# Patient Record
Sex: Male | Born: 1990 | Race: White | Hispanic: No | Marital: Married | Smoking: Never smoker
Health system: Southern US, Community
[De-identification: ages and names within clinical notes are randomized; demographics above are authoritative.]

## PROBLEM LIST (undated history)

## (undated) DIAGNOSIS — I493 Ventricular premature depolarization: Secondary | ICD-10-CM

## (undated) DIAGNOSIS — Z789 Other specified health status: Secondary | ICD-10-CM

## (undated) DIAGNOSIS — R002 Palpitations: Secondary | ICD-10-CM

## (undated) HISTORY — PX: OTHER SURGICAL HISTORY: SHX169

## (undated) HISTORY — DX: Other specified health status: Z78.9

## (undated) HISTORY — DX: Ventricular premature depolarization: I49.3

## (undated) HISTORY — DX: Palpitations: R00.2

---

## 2019-09-12 ENCOUNTER — Encounter (HOSPITAL_COMMUNITY): Payer: Self-pay

## 2019-09-12 ENCOUNTER — Emergency Department (HOSPITAL_COMMUNITY)
Admission: EM | Admit: 2019-09-12 | Discharge: 2019-09-12 | Disposition: A | Discharge status: Home / Self Care | Payer: Self-pay | Attending: Emergency Medicine | Admitting: Emergency Medicine

## 2019-09-12 ENCOUNTER — Other Ambulatory Visit: Payer: Self-pay

## 2019-09-12 DIAGNOSIS — R682 Dry mouth, unspecified: Secondary | ICD-10-CM | POA: Insufficient documentation

## 2019-09-12 DIAGNOSIS — R350 Frequency of micturition: Secondary | ICD-10-CM | POA: Insufficient documentation

## 2019-09-12 DIAGNOSIS — R002 Palpitations: Secondary | ICD-10-CM | POA: Insufficient documentation

## 2019-09-12 DIAGNOSIS — F419 Anxiety disorder, unspecified: Secondary | ICD-10-CM | POA: Insufficient documentation

## 2019-09-12 LAB — URINALYSIS, ROUTINE W REFLEX MICROSCOPIC
Bilirubin Urine: NEGATIVE
Glucose, UA: NEGATIVE mg/dL
Hgb urine dipstick: NEGATIVE
Ketones, ur: 20 mg/dL — AB
Leukocytes,Ua: NEGATIVE
Nitrite: NEGATIVE
Protein, ur: NEGATIVE mg/dL
Specific Gravity, Urine: 1.01 (ref 1.005–1.030)
pH: 6 (ref 5.0–8.0)

## 2019-09-12 LAB — COMPREHENSIVE METABOLIC PANEL
ALT: 24 U/L (ref 0–44)
AST: 24 U/L (ref 15–41)
Albumin: 4.4 g/dL (ref 3.5–5.0)
Alkaline Phosphatase: 65 U/L (ref 38–126)
Anion gap: 9 (ref 5–15)
BUN: 14 mg/dL (ref 6–20)
CO2: 26 mmol/L (ref 22–32)
Calcium: 9.2 mg/dL (ref 8.9–10.3)
Chloride: 104 mmol/L (ref 98–111)
Creatinine, Ser: 0.82 mg/dL (ref 0.61–1.24)
GFR calc Af Amer: >60 mL/min (ref >60)
GFR calc non Af Amer: >60 mL/min (ref >60)
Glucose, Bld: 106 mg/dL — ABNORMAL HIGH (ref 70–99)
Potassium: 3.5 mmol/L (ref 3.5–5.1)
Sodium: 139 mmol/L (ref 135–145)
Total Bilirubin: 0.8 mg/dL (ref 0.3–1.2)
Total Protein: 7.1 g/dL (ref 6.5–8.1)

## 2019-09-12 LAB — RAPID URINE DRUG SCREEN, HOSP PERFORMED
Amphetamines: NOT DETECTED
Barbiturates: NOT DETECTED
Benzodiazepines: NOT DETECTED
Cocaine: NOT DETECTED
Opiates: NOT DETECTED
Tetrahydrocannabinol: NOT DETECTED

## 2019-09-12 LAB — CBC
HCT: 41.2 % (ref 39.0–52.0)
Hemoglobin: 13.7 g/dL (ref 13.0–17.0)
MCH: 28.8 pg (ref 26.0–34.0)
MCHC: 33.3 g/dL (ref 30.0–36.0)
MCV: 86.6 fL (ref 80.0–100.0)
Platelets: 258 10*3/uL (ref 150–400)
RBC: 4.76 MIL/uL (ref 4.22–5.81)
RDW: 13.2 % (ref 11.5–15.5)
WBC: 5.4 10*3/uL (ref 4.0–10.5)
nRBC: 0 % (ref 0.0–0.2)

## 2019-09-12 LAB — CBG MONITORING, ED: Glucose-Capillary: 95 mg/dL (ref 70–99)

## 2019-09-12 LAB — TSH: TSH: 2.272 u[IU]/mL (ref 0.350–4.500)

## 2019-09-12 NOTE — ED Triage Notes (Addendum)
Pt reports feeling palpitations, increased urination, and dry mouth since he laid down to go to sleep a few hours ago. He reports increased stress and anxiety lately. Denies pain.

## 2019-09-12 NOTE — ED Notes (Signed)
Pt declined dc vitals.

## 2019-09-12 NOTE — ED Provider Notes (Signed)
[name] Provider Note   CSN: [id number] Arrival date & time: 09/12/19  0254     History Chief Complaint  Patient presents with  . Palpitations  . Dry Mouth    Lucas Austin is a 29 y.o. male.  Patient with no pertinent past medical history presents to the emergency department with a chief complaint of palpitations.  He states that he noticed the symptoms tonight.  He states that it feels like his "heart is jumping."  He denies pain or shortness of breath.  She denies any fevers, chills, or cough.  Denies ever experiencing any symptoms before.  Denies any stimulant use from some Diet Coke/caffeine.  He denies any drug or alcohol use.  Denies any other associated symptoms.  The history is provided by the patient. No language interpreter was used.       History reviewed. No pertinent past medical history.  There are no problems to display for this patient.    History reviewed. No pertinent family history.  Social History   Tobacco Use  . Smoking status: Not on file  Substance Use Topics  . Alcohol use: Not on file  . Drug use: Not on file    Home Medications Prior to Admission medications   Not on File    Allergies    Patient has no known allergies.  Review of Systems   Review of Systems  All other systems reviewed and are negative.   Physical Exam Updated Vital Signs BP (!) 152/94 (BP Location: Right Arm)   Pulse 98   Temp 98.7 F (37.1 C) (Oral)   Resp 20   Ht 5\' 7"  (1.702 m)   Wt 72.6 kg   SpO2 100%   BMI 25.06 kg/m   Physical Exam Vitals and nursing note reviewed.  Constitutional:      Appearance: He is well-developed.  HENT:     Head: Normocephalic and atraumatic.  Eyes:     Conjunctiva/sclera: Conjunctivae normal.  Cardiovascular:     Rate and Rhythm: Normal rate and regular rhythm.     Heart sounds: No murmur.     Comments: Normal rate on my exam Pulmonary:     Effort: Pulmonary effort is  normal. No respiratory distress.     Breath sounds: Normal breath sounds.  Abdominal:     Palpations: Abdomen is soft.     Tenderness: There is no abdominal tenderness.  Musculoskeletal:     Cervical back: Neck supple.  Skin:    General: Skin is warm and dry.  Neurological:     Mental Status: He is alert and oriented to person, place, and time.  Psychiatric:        Mood and Affect: Mood normal.     Comments: anxious     ED Results / Procedures / Treatments   Labs (all labs ordered are listed, but only abnormal results are displayed) Labs Reviewed  COMPREHENSIVE METABOLIC PANEL - Abnormal; Notable for the following components:      Result Value   Glucose, Bld 106 (*)    All other components within normal limits  URINALYSIS, ROUTINE W REFLEX MICROSCOPIC - Abnormal; Notable for the following components:   Color, Urine STRAW (*)    Ketones, ur 20 (*)    All other components within normal limits  CBC  TSH  RAPID URINE DRUG SCREEN, HOSP PERFORMED  CBG MONITORING, ED    EKG EKG Interpretation  Date/Time:  Sunday Sep 12 2019 03:20:19 EDT Ventricular  Rate:  100 PR Interval:    QRS Duration: 94 QT Interval:  325 QTC Calculation: 420 R Axis:   65 Text Interpretation: Sinus tachycardia 12 Lead; Mason-Likar No old tracing to compare Confirmed by Ward, Cyril Mourning [phone]) on 09/12/2019 3:27:14 AM   Radiology No results found.  Procedures Procedures (including critical care time)  Medications Ordered in ED Medications - No data to display  ED Course  I have reviewed the triage vital signs and the nursing notes.  Pertinent labs & imaging results that were available during my care of the patient were reviewed by me and considered in my medical decision making (see chart for details).    MDM Rules/Calculators/A&P                      This patient complains of palpitations, this involves an extensive number of treatment options, and is a complaint that carries with it a high  risk of complications and morbidity.  The differential diagnosis includes dysrhythmia, electrolyte abnormality, stimulant, anxiety.  Pertinent Labs I ordered, reviewed, and interpreted labs, which included CBC, CMP, TSH, UDS, UA, which are all reassuring.  He is not hypoxic or complaining of CP/SOB.  I doubt PE.  VSS. I don't think that the patient needs additional workup or admission tonight.  I think that he is stable for discharge and can follow-up with his PCP and/or cardiology.  Final Clinical Impression(s) / ED Diagnoses Final diagnoses:  Palpitations    Rx / DC Orders ED Discharge Orders    None       Montine Circle, PA-C 09/12/19 0505    Ward, Delice Bison, DO 09/12/19 [phone]

## 2019-09-14 ENCOUNTER — Emergency Department (HOSPITAL_COMMUNITY)
Admission: EM | Admit: 2019-09-14 | Discharge: 2019-09-14 | Disposition: A | Discharge status: Home / Self Care | Payer: Self-pay | Attending: Emergency Medicine | Admitting: Emergency Medicine

## 2019-09-14 ENCOUNTER — Encounter (HOSPITAL_COMMUNITY): Payer: Self-pay | Admitting: Emergency Medicine

## 2019-09-14 ENCOUNTER — Emergency Department (HOSPITAL_COMMUNITY): Payer: Self-pay

## 2019-09-14 DIAGNOSIS — R002 Palpitations: Secondary | ICD-10-CM | POA: Insufficient documentation

## 2019-09-14 DIAGNOSIS — Z79899 Other long term (current) drug therapy: Secondary | ICD-10-CM | POA: Insufficient documentation

## 2019-09-14 DIAGNOSIS — F419 Anxiety disorder, unspecified: Secondary | ICD-10-CM | POA: Insufficient documentation

## 2019-09-14 DIAGNOSIS — R0789 Other chest pain: Secondary | ICD-10-CM | POA: Insufficient documentation

## 2019-09-14 LAB — D-DIMER, QUANTITATIVE: D-Dimer, Quant: <0.27 ug/mL-FEU (ref 0.00–0.50)

## 2019-09-14 LAB — CBC
HCT: 45.8 % (ref 39.0–52.0)
Hemoglobin: 15.2 g/dL (ref 13.0–17.0)
MCH: 29 pg (ref 26.0–34.0)
MCHC: 33.2 g/dL (ref 30.0–36.0)
MCV: 87.4 fL (ref 80.0–100.0)
Platelets: 271 10*3/uL (ref 150–400)
RBC: 5.24 MIL/uL (ref 4.22–5.81)
RDW: 13 % (ref 11.5–15.5)
WBC: 5.2 10*3/uL (ref 4.0–10.5)
nRBC: 0 % (ref 0.0–0.2)

## 2019-09-14 LAB — BASIC METABOLIC PANEL
Anion gap: 11 (ref 5–15)
BUN: 9 mg/dL (ref 6–20)
CO2: 27 mmol/L (ref 22–32)
Calcium: 9.5 mg/dL (ref 8.9–10.3)
Chloride: 102 mmol/L (ref 98–111)
Creatinine, Ser: 0.74 mg/dL (ref 0.61–1.24)
GFR calc Af Amer: >60 mL/min (ref >60)
GFR calc non Af Amer: >60 mL/min (ref >60)
Glucose, Bld: 102 mg/dL — ABNORMAL HIGH (ref 70–99)
Potassium: 4.2 mmol/L (ref 3.5–5.1)
Sodium: 140 mmol/L (ref 135–145)

## 2019-09-14 LAB — MAGNESIUM: Magnesium: 2.1 mg/dL (ref 1.7–2.4)

## 2019-09-14 LAB — TROPONIN I (HIGH SENSITIVITY): Troponin I (High Sensitivity): 2 ng/L (ref <18)

## 2019-09-14 MED ORDER — SODIUM CHLORIDE 0.9% FLUSH: 3.0000 mL | Freq: Once | INTRAVENOUS | Status: DC

## 2019-09-14 MED ORDER — HYDROXYZINE HCL 25 MG PO TABS
25.0000 mg | ORAL_TABLET | Freq: Four times a day (QID) | ORAL | 0 refills | Status: DC
Start: 2019-09-14 — End: 2019-10-02

## 2019-09-14 NOTE — ED Triage Notes (Signed)
Patient here from home reporting palpitations since Saturday. Seen for same on 5/23. Also reports increased stress and anxiety.

## 2019-09-14 NOTE — Discharge Instructions (Addendum)
You were labs today did not show a specific cause for your palpitations. It is important for you to follow-up with the cardiologist tomorrow for possible monitoring. Return to the ER for any worsening palpitations, chest pain, shortness of breath, leg swelling.

## 2019-09-14 NOTE — ED Provider Notes (Signed)
[facility]-EMERGENCY DEPT Provider Note   CSN: [id number] Arrival date & time: 09/14/19  1313     History Chief Complaint  Patient presents with  . Palpitations    Lucas Austin is a 29 y.o. male who presents to ED with a chief complaint of palpitations.  Beginning 09/12/2019 patient reports intermittent palpitations without specific aggravating or alleviating factor.  Reports a burning sensation in his chest as well.  He was seen and evaluated when symptoms began with reassuring work-up including TSH.  He initially thought this was due to using Rogaine for his hair loss as he read on Google that this could be a side effect.  However since then he has not to use the Rogaine but continues to have symptoms.  He does report increased anxiety since his symptoms began.  He denies any shortness of breath, leg swelling, history of DVT PE, MI, recent immobilization, vomiting, diarrhea, abdominal pain.  HPI     History reviewed. No pertinent past medical history.  There are no problems to display for this patient.   History reviewed. No pertinent surgical history.     No family history on file.  Social History   Tobacco Use  . Smoking status: Never Smoker  . Smokeless tobacco: Never Used  Substance Use Topics  . Alcohol use: Never  . Drug use: Never    Home Medications Prior to Admission medications   Medication Sig Start Date End Date Taking? Authorizing Provider  MINOXIDIL, TOPICAL, (ROGAINE EXTRA STRENGTH) 5 % SOLN Apply 1 application topically in the morning and at bedtime.   Yes [provider]  hydrOXYzine (ATARAX/VISTARIL) 25 MG tablet Take 1 tablet (25 mg total) by mouth every 6 (six) hours. 09/14/19   Dietrich Pates, PA-C    Allergies    Patient has no known allergies.  Review of Systems   Review of Systems  Constitutional: Negative for appetite change, chills and fever.  HENT: Negative for ear pain, rhinorrhea, sneezing and  sore throat.   Eyes: Negative for photophobia and visual disturbance.  Respiratory: Positive for chest tightness. Negative for cough, shortness of breath and wheezing.   Cardiovascular: Positive for palpitations. Negative for chest pain.  Gastrointestinal: Negative for abdominal pain, blood in stool, constipation, diarrhea, nausea and vomiting.  Genitourinary: Negative for dysuria, hematuria and urgency.  Musculoskeletal: Negative for myalgias.  Skin: Negative for rash.  Neurological: Negative for dizziness, weakness and light-headedness.    Physical Exam Updated Vital Signs BP 127/69   Pulse 87   Temp 98.3 F (36.8 C) (Oral)   Resp 16   SpO2 98%   Physical Exam Vitals and nursing note reviewed.  Constitutional:      General: He is not in acute distress.    Appearance: He is well-developed.  HENT:     Head: Normocephalic and atraumatic.     Nose: Nose normal.  Eyes:     General: No scleral icterus.       Right eye: No discharge.        Left eye: No discharge.     Conjunctiva/sclera: Conjunctivae normal.  Cardiovascular:     Rate and Rhythm: Normal rate and regular rhythm.     Heart sounds: Normal heart sounds. No murmur. No friction rub. No gallop.   Pulmonary:     Effort: Pulmonary effort is normal. No respiratory distress.     Breath sounds: Normal breath sounds.  Abdominal:     General: Bowel sounds are normal. There  is no distension.     Palpations: Abdomen is soft.     Tenderness: There is no abdominal tenderness. There is no guarding.  Musculoskeletal:        General: Normal range of motion.     Cervical back: Normal range of motion and neck supple.     Right lower leg: No edema.     Left lower leg: No edema.  Skin:    General: Skin is warm and dry.     Findings: No rash.  Neurological:     Mental Status: He is alert.     Motor: No abnormal muscle tone.     Coordination: Coordination normal.     ED Results / Procedures / Treatments   Labs (all labs  ordered are listed, but only abnormal results are displayed) Labs Reviewed  BASIC METABOLIC PANEL - Abnormal; Notable for the following components:      Result Value   Glucose, Bld 102 (*)    All other components within normal limits  CBC  D-DIMER, QUANTITATIVE (NOT AT [facility])  MAGNESIUM  TROPONIN I (HIGH SENSITIVITY)    EKG EKG Interpretation  Date/Time:  Tuesday Sep 14 2019 13:37:35 EDT Ventricular Rate:  87 PR Interval:    QRS Duration: 90 QT Interval:  323 QTC Calculation: 389 R Axis:   71 Text Interpretation: Sinus rhythm 12 Lead; Mason-Likar No significant change since last tracing Confirmed by Quintella Reichert [phone]) on 09/14/2019 6:45:52 PM   Radiology DG Chest 2 View  Result Date: 09/14/2019 CLINICAL DATA:  Chest palpitations since last night.  Chest burning. EXAM: CHEST - 2 VIEW COMPARISON:  None. FINDINGS: Normal heart, mediastinum and hila. Clear lungs.  No pleural effusion or pneumothorax. Skeletal structures are within normal limits. IMPRESSION: Normal chest radiographs. Electronically Signed   By: Lajean Manes M.D.   On: 09/14/2019 14:15    Procedures Procedures (including critical care time)  Medications Ordered in ED Medications - No data to display  ED Course  I have reviewed the triage vital signs and the nursing notes.  Pertinent labs & imaging results that were available during my care of the patient were reviewed by me and considered in my medical decision making (see chart for details).    MDM Rules/Calculators/A&P                      29 year old male presenting to the ED with a chief complaint of palpitations.  Symptoms have been going on for the past 2 to 3 days.  He was seen and evaluated 2 days ago for similar symptoms with reassuring work-up.  His symptoms have persisted despite discontinuing the Rogaine which he thought caused his symptoms.  He does report increased anxiety since he started having the palpitations.  On exam patient is overall  well-appearing.  He is not tachycardic, tachypneic or hypoxic.  No lower extremity edema, erythema or calf tenderness that concern me for DVT.  He is speaking in complete sentences without difficulty, no signs of respiratory distress.  Reviewed work-up from his visit 2 days ago.  TSH was normal then.  EKG here shows sinus rhythm, no changes from prior tracings.  Chest x-ray is unremarkable.  Obtain D-dimer due to ongoing palpitations.  This was negative.  Troponin is negative x1.  Magnesium, BMP and CBC unremarkable.  Suspect that symptoms could be due to anxiety.  Patient has an appointment with cardiologist tomorrow.  In the meantime we will give him Atarax to  help with anxiety and have him follow-up.  Patient advised to return for worsening symptoms.  All imaging, if done today, including plain films, CT scans, and ultrasounds, independently reviewed by me, and interpretations confirmed via formal radiology reads.  Patient is hemodynamically stable, in NAD, and able to ambulate in the ED. Evaluation does not show pathology that would require ongoing emergent intervention or inpatient treatment. I explained the diagnosis to the patient. Pain has been managed and has no complaints prior to discharge. Patient is comfortable with above plan and is stable for discharge at this time. All questions were answered prior to disposition. Strict return precautions for returning to the ED were discussed. Encouraged follow up with PCP.   An After Visit Summary was printed and given to the patient.   Portions of this note were generated with Scientist, clinical (histocompatibility and immunogenetics). Dictation errors may occur despite best attempts at proofreading.  Final Clinical Impression(s) / ED Diagnoses Final diagnoses:  Palpitations    Rx / DC Orders ED Discharge Orders         Ordered    hydrOXYzine (ATARAX/VISTARIL) 25 MG tablet  Every 6 hours     09/14/19 1934           Dietrich Pates, Cordelia Poche 09/14/19 Margie Billet, MD 09/16/19 [phone]

## 2019-09-15 ENCOUNTER — Telehealth: Payer: Self-pay | Admitting: Radiology

## 2019-09-15 ENCOUNTER — Ambulatory Visit (INDEPENDENT_AMBULATORY_CARE_PROVIDER_SITE_OTHER): Payer: Self-pay | Admitting: Cardiology

## 2019-09-15 ENCOUNTER — Other Ambulatory Visit: Payer: Self-pay

## 2019-09-15 ENCOUNTER — Encounter: Payer: Self-pay | Admitting: Cardiology

## 2019-09-15 VITALS — BP 112/72 | HR 110 | Ht 67.0 in | Wt 159.0 lb

## 2019-09-15 DIAGNOSIS — Z789 Other specified health status: Secondary | ICD-10-CM | POA: Insufficient documentation

## 2019-09-15 DIAGNOSIS — R0789 Other chest pain: Secondary | ICD-10-CM

## 2019-09-15 DIAGNOSIS — R002 Palpitations: Secondary | ICD-10-CM

## 2019-09-15 NOTE — Patient Instructions (Signed)
Medication Instructions:  Your physician recommends that you continue on your current medications as directed. Please refer to the Current Medication list given to you today.  *If you need a refill on your cardiac medications before your next appointment, please call your pharmacy*  Testing/Procedures: Your physician has recommended that you wear an event monitor. Event monitors are medical devices that record the heart's electrical activity. Doctors most often us these monitors to diagnose arrhythmias. Arrhythmias are problems with the speed or rhythm of the heartbeat. The monitor is a small, portable device. You can wear one while you do your normal daily activities. This is usually used to diagnose what is causing palpitations/syncope (passing out).  Follow-Up: At [facility], you and your health needs are our priority.  As part of our continuing mission to provide you with exceptional heart care, we have created designated Provider Care Teams.  These Care Teams include your primary Cardiologist (physician) and Advanced Practice Providers (APPs -  Physician Assistants and Nurse Practitioners) who all work together to provide you with the care you need, when you need it.  Follow up with Dr. Turner as needed based on results of testing   Other Instructions ZIO XT- Long Term Monitor Instructions   Your physician has requested you wear your ZIO patch monitor__14_days.   This is a single patch monitor.  [employer] supplies one patch monitor per enrollment.  Additional stickers are not available.   Please do not apply patch if you will be having a Nuclear Stress Test, Echocardiogram, Cardiac CT, MRI, or Chest Xray during the time frame you would be wearing the monitor. The patch cannot be worn during these tests.  You cannot remove and re-apply the ZIO XT patch monitor.   Your ZIO patch monitor will be sent USPS Priority mail from [employer] directly to your home address. The monitor  may also be mailed to a PO BOX if home delivery is not available.   It may take 3-5 days to receive your monitor after you have been enrolled.   Once you have received you monitor, please review enclosed instructions.  Your monitor has already been registered assigning a specific monitor serial # to you.   Applying the monitor   Shave hair from upper left chest.   Hold abrader disc by orange tab.  Rub abrader in 40 strokes over left upper chest as indicated in your monitor instructions.   Clean area with 4 enclosed alcohol pads .  Use all pads to assure are is cleaned thoroughly.  Let dry.   Apply patch as indicated in monitor instructions.  Patch will be place under collarbone on left side of chest with arrow pointing upward.   Rub patch adhesive wings for 2 minutes.Remove white label marked "1".  Remove white label marked "2".  Rub patch adhesive wings for 2 additional minutes.   While looking in a mirror, press and release button in center of patch.  A small green light will flash 3-4 times .  This will be your only indicator the monitor has been turned on.     Do not shower for the first 24 hours.  You may shower after the first 24 hours.   Press button if you feel a symptom. You will hear a small click.  Record Date, Time and Symptom in the Patient Log Book.   When you are ready to remove patch, follow instructions on last 2 pages of Patient Log Book.  Stick patch monitor onto last   page of Patient Log Book.   Place Patient Log Book in Blue box.  Use locking tab on box and tape box closed securely.  The Orange and White box has prepaid postage on it.  Please place in mailbox as soon as possible.  Your physician should have your test results approximately 7 days after the monitor has been mailed back to [employer].   Call [employer] Customer Care at [phone] if you have questions regarding your ZIO XT patch monitor.  Call them immediately if you see an orange light blinking  on your monitor.   If your monitor falls off in less than 4 days contact our Monitor department at [phone].  If your monitor becomes loose or falls off after 4 days call [employer] at [phone] for suggestions on securing your monitor.   

## 2019-09-15 NOTE — Progress Notes (Signed)
Cardiology Consult Note    Date:  09/16/2019   ID:  Lucas Austin, DOB 08/19/1990, MRN [id number]  PCP:  Patient, No Pcp Per  Cardiologist:  Armanda Magic, MD   Chief Complaint  Patient presents with  . New Patient (Initial Visit)    Palpitations and chest burning    History of Present Illness:  Lucas Austin is a 29 y.o. male who is being seen today for the evaluation of palpitations at the request of Ward, Layla Maw, DO.  This is a 29yo male with no PMH who recently saw his PCP and complained of palpitations and is not here for further evaluation.  He says that starting on 09/12/3019 he noticed intermittent palpitations that would come out of the blue without any known trigger.  He says that it would feel like a skipped beat once every 5 minutes.  He also was complaining of a burning sensation in his chest.  He was evaluated in ER when it started with normal workup with EKG,TSH.  He though it was related to Rogaine for his hair loss.  He presented back to the ER with recurrent palpitations.  Cxray, EKG and labs including trop and ddimer were normal.  He had a lot of anxiety and sx felt related to that.  He is now here for further evaluation. Since the last ER visit his palpitations have improved and seem to have tapered off and has not had any since yesterday.  He says that the burning sensation he had lasted 1-2 minutes and only had it 1-2 times and none since then.  He also had noticed some tingling in his head.   Past Medical History:  Diagnosis Date  . Known health problems: none     Past Surgical History:  Procedure Laterality Date  . no surgical history      Current Medications: No outpatient medications have been marked as taking for the 09/15/19 encounter (Office Visit) with Quintella Reichert, MD.    Allergies:   Patient has no known allergies.   Social History   Socioeconomic History  . Marital status: Single    Spouse name: Not on file  . Number of  children: Not on file  . Years of education: Not on file  . Highest education level: Not on file  Occupational History  . Not on file  Tobacco Use  . Smoking status: Never Smoker  . Smokeless tobacco: Never Used  Substance and Sexual Activity  . Alcohol use: Never  . Drug use: Never  . Sexual activity: Not on file  Other Topics Concern  . Not on file  Social History Narrative  . Not on file   Social Determinants of Health   Financial Resource Strain:   . Difficulty of Paying Living Expenses:   Food Insecurity:   . Worried About Programme researcher, broadcasting/film/video in the Last Year:   . Barista in the Last Year:   Transportation Needs:   . Freight forwarder (Medical):   Marland Kitchen Lack of Transportation (Non-Medical):   Physical Activity:   . Days of Exercise per Week:   . Minutes of Exercise per Session:   Stress:   . Feeling of Stress :   Social Connections:   . Frequency of Communication with Friends and Family:   . Frequency of Social Gatherings with Friends and Family:   . Attends Religious Services:   . Active Member of Clubs or Organizations:   . Attends  Club or Organization Meetings:   Marland Kitchen Marital Status:      Family History:  The patient's family history includes Cystic fibrosis in his mother.   ROS:   Please see the history of present illness.    ROS All other systems reviewed and are negative.  No flowsheet data found.   PHYSICAL EXAM:   VS:  BP 112/72   Pulse (!) 110   Ht 5\' 7"  (1.702 m)   Wt 159 lb (72.1 kg)   SpO2 97%   BMI 24.90 kg/m    GEN: Well nourished, well developed, in no acute distress  HEENT: normal  Neck: no JVD, carotid bruits, or masses Cardiac: RRR; no murmurs, rubs, or gallops,no edema.  Intact distal pulses bilaterally.  Respiratory:  clear to auscultation bilaterally, normal work of breathing GI: soft, nontender, nondistended, + BS MS: no deformity or atrophy  Skin: warm and dry, no rash Neuro:  Alert and Oriented x 3, Strength and  sensation are intact Psych: euthymic mood, full affect  Wt Readings from Last 3 Encounters:  09/15/19 159 lb (72.1 kg)  09/12/19 160 lb (72.6 kg)     Studies/Labs Reviewed:   EKG:  EKG is not ordered today.   Recent Labs: 09/12/2019: ALT 24; TSH 2.272 09/14/2019: BUN 9; Creatinine, Ser 0.74; Hemoglobin 15.2; Magnesium 2.1; Platelets 271; Potassium 4.2; Sodium 140   Lipid Panel No results found for: CHOL, TRIG, HDL, CHOLHDL, VLDL, LDLCALC, LDLDIRECT  Additional studies/ records that were reviewed today include:  Hospital notes from ER, EKG  ASSESSMENT:    1. Palpitations   2. Burning chest pain      PLAN:  In order of problems listed above:  1. Palpitations -the palpitations feel like skipped heart beats and suspect that they are PVCs or PACs -possibly related to anxiety -will get a 2 week ziopatch to assess further  2.  Chest Burning -sounds like GERD or anxiety and occurred when he got up in the am and only lasted 1 minute -he denies any symptoms when he exerts himself -EKG is nonischemic and workup in ER normal with normal trop and neg ddimer -he has no CRFs -no further workup at this time    Medication Adjustments/Labs and Tests Ordered: Current medicines are reviewed at length with the patient today.  Concerns regarding medicines are outlined above.  Medication changes, Labs and Tests ordered today are listed in the Patient Instructions below.  Patient Instructions  Medication Instructions:  Your physician recommends that you continue on your current medications as directed. Please refer to the Current Medication list given to you today.  *If you need a refill on your cardiac medications before your next appointment, please call your pharmacy*  Testing/Procedures: Your physician has recommended that you wear an event monitor. Event monitors are medical devices that record the heart's electrical activity. Doctors most often [place] these monitors to diagnose  arrhythmias. Arrhythmias are problems with the speed or rhythm of the heartbeat. The monitor is a small, portable device. You can wear one while you do your normal daily activities. This is usually used to diagnose what is causing palpitations/syncope (passing out).  Follow-Up: At [facility], you and your health needs are our priority.  As part of our continuing mission to provide you with exceptional heart care, we have created designated Provider Care Teams.  These Care Teams include your primary Cardiologist (physician) and Advanced Practice Providers (APPs -  Physician Assistants and Nurse Practitioners) who all work together to  provide you with the care you need, when you need it.  Follow up with Dr. Mayford Knife as needed based on results of testing.    Other Instructions ZIO XT- Long Term Monitor Instructions   Your physician has requested you wear your ZIO patch monitor_14_days.   This is a single patch monitor.  [employer] supplies one patch monitor per enrollment.  Additional stickers are not available.   Please do not apply patch if you will be having a Nuclear Stress Test, Echocardiogram, Cardiac CT, MRI, or Chest Xray during the time frame you would be wearing the monitor. The patch cannot be worn during these tests.  You cannot remove and re-apply the ZIO XT patch monitor.   Your ZIO patch monitor will be sent USPS Priority mail from [facility] directly to your home address. The monitor may also be mailed to a PO BOX if home delivery is not available.   It may take 3-5 days to receive your monitor after you have been enrolled.   Once you have received you monitor, please review enclosed instructions.  Your monitor has already been registered assigning a specific monitor serial # to you.   Applying the monitor   Shave hair from upper left chest.   Hold abrader disc by orange tab.  Rub abrader in 40 strokes over left upper chest as indicated in your monitor instructions.    Clean area with 4 enclosed alcohol pads .  Use all pads to assure are is cleaned thoroughly.  Let dry.   Apply patch as indicated in monitor instructions.  Patch will be place under collarbone on left side of chest with arrow pointing upward.   Rub patch adhesive wings for 2 minutes.Remove white label marked "1".  Remove white label marked "2".  Rub patch adhesive wings for 2 additional minutes.   While looking in a mirror, press and release button in center of patch.  A small green light will flash 3-4 times .  This will be your only indicator the monitor has been turned on.     Do not shower for the first 24 hours.  You may shower after the first 24 hours.   Press button if you feel a symptom. You will hear a small click.  Record Date, Time and Symptom in the Patient Log Book.   When you are ready to remove patch, follow instructions on last 2 pages of Patient Log Book.  Stick patch monitor onto last page of Patient Log Book.   Place Patient Log Book in [place] box.  Use locking tab on box and tape box closed securely.  The [employer] has [employer] on it.  Please place in mailbox as soon as possible.  Your physician should have your test results approximately 7 days after the monitor has been mailed back to [facility].   Call [facility] Customer Care at [phone] if you have questions regarding your ZIO XT patch monitor.  Call them immediately if you see an orange light blinking on your monitor.   If your monitor falls off in less than 4 days contact our Monitor department at [phone].  If your monitor becomes loose or falls off after 4 days call [employer] at [phone] for suggestions on securing your monitor.      Signed, Armanda Magic, MD  09/16/2019 7:56 AM    [facility] [address] [place], [place], [place]  [address] Phone: [phone]; Fax: [phone]

## 2019-09-15 NOTE — Telephone Encounter (Signed)
Enrolled patient for a 14 day Zio monitor to be mailed to patients home.  

## 2019-09-21 ENCOUNTER — Ambulatory Visit (INDEPENDENT_AMBULATORY_CARE_PROVIDER_SITE_OTHER): Payer: Self-pay

## 2019-09-21 DIAGNOSIS — R002 Palpitations: Secondary | ICD-10-CM

## 2019-09-23 ENCOUNTER — Emergency Department (HOSPITAL_COMMUNITY)
Admission: EM | Admit: 2019-09-23 | Discharge: 2019-09-24 | Disposition: A | Discharge status: Home / Self Care | Payer: Self-pay | Attending: Emergency Medicine | Admitting: Emergency Medicine

## 2019-09-23 ENCOUNTER — Encounter (HOSPITAL_COMMUNITY): Payer: Self-pay

## 2019-09-23 ENCOUNTER — Emergency Department (HOSPITAL_COMMUNITY): Payer: Self-pay

## 2019-09-23 ENCOUNTER — Other Ambulatory Visit: Payer: Self-pay

## 2019-09-23 DIAGNOSIS — R002 Palpitations: Secondary | ICD-10-CM | POA: Insufficient documentation

## 2019-09-23 DIAGNOSIS — F419 Anxiety disorder, unspecified: Secondary | ICD-10-CM | POA: Insufficient documentation

## 2019-09-23 DIAGNOSIS — Z79899 Other long term (current) drug therapy: Secondary | ICD-10-CM | POA: Insufficient documentation

## 2019-09-23 LAB — CBC
HCT: 43.5 % (ref 39.0–52.0)
Hemoglobin: 14.2 g/dL (ref 13.0–17.0)
MCH: 28.5 pg (ref 26.0–34.0)
MCHC: 32.6 g/dL (ref 30.0–36.0)
MCV: 87.2 fL (ref 80.0–100.0)
Platelets: 281 10*3/uL (ref 150–400)
RBC: 4.99 MIL/uL (ref 4.22–5.81)
RDW: 13.2 % (ref 11.5–15.5)
WBC: 6.1 10*3/uL (ref 4.0–10.5)
nRBC: 0 % (ref 0.0–0.2)

## 2019-09-23 LAB — BASIC METABOLIC PANEL
Anion gap: 9 (ref 5–15)
BUN: 8 mg/dL (ref 6–20)
CO2: 27 mmol/L (ref 22–32)
Calcium: 9.2 mg/dL (ref 8.9–10.3)
Chloride: 103 mmol/L (ref 98–111)
Creatinine, Ser: 0.9 mg/dL (ref 0.61–1.24)
GFR calc Af Amer: >60 mL/min (ref >60)
GFR calc non Af Amer: >60 mL/min (ref >60)
Glucose, Bld: 124 mg/dL — ABNORMAL HIGH (ref 70–99)
Potassium: 3.5 mmol/L (ref 3.5–5.1)
Sodium: 139 mmol/L (ref 135–145)

## 2019-09-23 LAB — TROPONIN I (HIGH SENSITIVITY): Troponin I (High Sensitivity): <2 ng/L (ref <18)

## 2019-09-23 MED ORDER — SODIUM CHLORIDE 0.9% FLUSH: 3.0000 mL | Freq: Once | INTRAVENOUS | Status: DC

## 2019-09-23 NOTE — ED Triage Notes (Signed)
Pt bib [facility] from home w/ c/o tachycardia. Pt has been seeing a cardiologist for tachycardia and has been wearing a halter monitor. Pt was sitting at home when he noted his HR 180 and accompanied by a burning sensation in chest. Pt denies SOB, N/V, dizziness, weakness, diaphoresis during this episode. Pt HR down to 116 on EMS arrival, all VSS. Pt NAD in triage.

## 2019-09-24 ENCOUNTER — Encounter (HOSPITAL_COMMUNITY): Payer: Self-pay | Admitting: Emergency Medicine

## 2019-09-24 LAB — TROPONIN I (HIGH SENSITIVITY): Troponin I (High Sensitivity): 3 ng/L (ref <18)

## 2019-09-24 NOTE — ED Provider Notes (Signed)
[facility] EMERGENCY DEPARTMENT Provider Note   CSN: [id number] Arrival date & time: 09/23/19  2206     History Chief Complaint  Patient presents with  . Tachycardia    Lucas Austin is a 29 y.o. male.  The history is provided by the patient.  Palpitations Palpitations quality:  Regular Onset quality:  Sudden Timing:  Constant Progression:  Resolved Chronicity:  New Context: anxiety   Relieved by:  Nothing Worsened by:  Nothing Ineffective treatments:  None tried Associated symptoms: no back pain, no chest pain, no chest pressure, no cough, no diaphoresis, no dizziness, no hemoptysis, no leg pain, no lower extremity edema, no malaise/fatigue, no nausea, no near-syncope, no numbness, no orthopnea, no PND, no shortness of breath, no syncope, no vomiting and no weakness   Risk factors: no diabetes mellitus and no hx of PE   Patient with h/o anxiety presents with palpitations.  No f/c/r.  No new medications.  Started while at his computer.  States he took his atarax without immediate relief.  No travel no leg pain, no SOB.       Past Medical History:  Diagnosis Date  . Known health problems: none     Patient Active Problem List   Diagnosis Date Noted  . Known health problems: none     Past Surgical History:  Procedure Laterality Date  . no surgical history         Family History  Problem Relation Age of Onset  . Cystic fibrosis Mother     Social History   Tobacco Use  . Smoking status: Never Smoker  . Smokeless tobacco: Never Used  Substance Use Topics  . Alcohol use: Never  . Drug use: Never    Home Medications Prior to Admission medications   Medication Sig Start Date End Date Taking? Authorizing Provider  hydrOXYzine (ATARAX/VISTARIL) 25 MG tablet Take 1 tablet (25 mg total) by mouth every 6 (six) hours. Patient not taking: Reported on 09/15/2019 09/14/19   Delia Heady, PA-C  MINOXIDIL, TOPICAL, (ROGAINE EXTRA STRENGTH) 5  % SOLN Apply 1 application topically in the morning and at bedtime.    [provider]    Allergies    Patient has no known allergies.  Review of Systems   Review of Systems  Constitutional: Negative for diaphoresis and malaise/fatigue.  HENT: Negative for congestion.   Eyes: Negative for visual disturbance.  Respiratory: Negative for cough, hemoptysis and shortness of breath.   Cardiovascular: Positive for palpitations. Negative for chest pain, orthopnea, syncope, PND and near-syncope.  Gastrointestinal: Negative for nausea and vomiting.  Genitourinary: Negative for difficulty urinating.  Musculoskeletal: Negative for back pain.  Skin: Negative for rash.  Neurological: Negative for dizziness, weakness and numbness.  Psychiatric/Behavioral: The patient is nervous/anxious.   All other systems reviewed and are negative.   Physical Exam Updated Vital Signs BP 133/78   Pulse 85   Temp 99.2 F (37.3 C) (Oral)   Resp 16   SpO2 99%   Physical Exam Vitals and nursing note reviewed.  Constitutional:      General: He is not in acute distress.    Appearance: Normal appearance.  HENT:     Head: Normocephalic and atraumatic.     Nose: Nose normal.  Eyes:     Conjunctiva/sclera: Conjunctivae normal.     Pupils: Pupils are equal, round, and reactive to light.  Cardiovascular:     Rate and Rhythm: Normal rate and regular rhythm.  Pulses: Normal pulses.     Heart sounds: Normal heart sounds.  Pulmonary:     Effort: Pulmonary effort is normal.     Breath sounds: Normal breath sounds.  Abdominal:     General: Abdomen is flat. Bowel sounds are normal.     Tenderness: There is no abdominal tenderness. There is no guarding.  Musculoskeletal:        General: No tenderness. Normal range of motion.     Cervical back: Normal range of motion and neck supple.     Right lower leg: No edema.     Left lower leg: No edema.  Skin:    General: Skin is warm and dry.     Capillary  Refill: Capillary refill takes less than 2 seconds.  Neurological:     General: No focal deficit present.     Mental Status: He is alert and oriented to person, place, and time.  Psychiatric:        Mood and Affect: Mood is anxious.     Comments: tremulous     ED Results / Procedures / Treatments   Labs (all labs ordered are listed, but only abnormal results are displayed) Results for orders placed or performed during the hospital encounter of 09/23/19  Basic metabolic panel  Result Value Ref Range   Sodium 139 135 - 145 mmol/L   Potassium 3.5 3.5 - 5.1 mmol/L   Chloride 103 98 - 111 mmol/L   CO2 27 22 - 32 mmol/L   Glucose, Bld 124 (H) 70 - 99 mg/dL   BUN 8 6 - 20 mg/dL   Creatinine, Ser 6.60 0.61 - 1.24 mg/dL   Calcium 9.2 8.9 - 63.0 mg/dL   GFR calc non Af Amer >60 >60 mL/min   GFR calc Af Amer >60 >60 mL/min   Anion gap 9 5 - 15  CBC  Result Value Ref Range   WBC 6.1 4.0 - 10.5 K/uL   RBC 4.99 4.22 - 5.81 MIL/uL   Hemoglobin 14.2 13.0 - 17.0 g/dL   HCT 16.0 10.9 - 32.3 %   MCV 87.2 80.0 - 100.0 fL   MCH 28.5 26.0 - 34.0 pg   MCHC 32.6 30.0 - 36.0 g/dL   RDW 55.7 32.2 - 02.5 %   Platelets 281 150 - 400 K/uL   nRBC 0.0 0.0 - 0.2 %  Troponin I (High Sensitivity)  Result Value Ref Range   Troponin I (High Sensitivity) <2 <18 ng/L  Troponin I (High Sensitivity)  Result Value Ref Range   Troponin I (High Sensitivity) 3 <18 ng/L   DG Chest 2 View  Result Date: 09/23/2019 CLINICAL DATA:  Chest pain EXAM: CHEST - 2 VIEW COMPARISON:  09/14/2019 FINDINGS: Heart and mediastinal contours are within normal limits. No focal opacities or effusions. No acute bony abnormality. IMPRESSION: No active cardiopulmonary disease. Electronically Signed   By: Charlett Nose M.D.   On: 09/23/2019 22:36   DG Chest 2 View  Result Date: 09/14/2019 CLINICAL DATA:  Chest palpitations since last night.  Chest burning. EXAM: CHEST - 2 VIEW COMPARISON:  None. FINDINGS: Normal heart, mediastinum and  hila. Clear lungs.  No pleural effusion or pneumothorax. Skeletal structures are within normal limits. IMPRESSION: Normal chest radiographs. Electronically Signed   By: Amie Portland M.D.   On: 09/14/2019 14:15    EKG EKG Interpretation  Date/Time:  Thursday September 23 2019 22:12:00 EDT Ventricular Rate:  78 PR Interval:  120 QRS Duration: 92 QT Interval:  332 QTC Calculation: 378 R Axis:   62 Text Interpretation: Normal sinus rhythm Confirmed by Nicanor Alcon, Aerin Delany ([id number]) on 09/24/2019 3:57:46 AM   Radiology DG Chest 2 View  Result Date: 09/23/2019 CLINICAL DATA:  Chest pain EXAM: CHEST - 2 VIEW COMPARISON:  09/14/2019 FINDINGS: Heart and mediastinal contours are within normal limits. No focal opacities or effusions. No acute bony abnormality. IMPRESSION: No active cardiopulmonary disease. Electronically Signed   By: Charlett Nose M.D.   On: 09/23/2019 22:36    Procedures Procedures (including critical care time)  Medications Ordered in ED Medications  sodium chloride flush (NS) 0.9 % injection 3 mL (has no administration in time range)    ED Course  I have reviewed the triage vital signs and the nursing notes.  Pertinent labs & imaging results that were available during my care of the patient were reviewed by me and considered in my medical decision making (see chart for details).    PERC negative wells 0 highly doubt PE in this low risk patient.    Ruled out for MI in the ED, heart score is 1.  I suspect this is all anxiety.  No tachycardia here.  All labs and imaging are normal.  Follow up with your PMD to discuss ongoing therapy.    Lucas Austin was evaluated in Emergency Department on 09/24/2019 for the symptoms described in the history of present illness. He was evaluated in the context of the global COVID-19 pandemic, which necessitated consideration that the patient might be at risk for infection with the SARS-CoV-2 virus that causes COVID-19. Institutional protocols and  algorithms that pertain to the evaluation of patients at risk for COVID-19 are in a state of rapid change based on information released by regulatory bodies including the CDC and federal and state organizations. These policies and algorithms were followed during the patient's care in the ED.  Final Clinical Impression(s) / ED Diagnoses Return for intractable cough, coughing up blood,fevers >100.4 unrelieved by medication, shortness of breath, intractable vomiting, chest pain, shortness of breath, weakness,numbness, changes in speech, facial asymmetry,abdominal pain, passing out,Inability to tolerate liquids or food, cough, altered mental status or any concerns. No signs of systemic illness or infection. The patient is nontoxic-appearing on exam and vital signs are within normal limits.   I have reviewed the triage vital signs and the nursing notes. Pertinent labs &imaging results that were available during my care of the patient were reviewed by me and considered in my medical decision making (see chart for details).After history, exam, and medical workup I feel the patient has beenappropriately medically screened and is safe for discharge home. Pertinent diagnoses were discussed with the patient. Patient was given return precautions.   Twanda Stakes, MD 09/24/19 [id number]

## 2019-09-24 NOTE — ED Notes (Signed)
Patient verbalizes understanding of discharge instructions. Opportunity for questioning and answers were provided. Armband removed by staff, pt discharged from ED. Pt. ambulatory and discharged home.  

## 2019-10-02 ENCOUNTER — Emergency Department (HOSPITAL_COMMUNITY)
Admission: EM | Admit: 2019-10-02 | Discharge: 2019-10-02 | Disposition: A | Discharge status: Home / Self Care | Payer: Self-pay | Attending: Emergency Medicine | Admitting: Emergency Medicine

## 2019-10-02 ENCOUNTER — Emergency Department (HOSPITAL_COMMUNITY): Payer: Self-pay

## 2019-10-02 ENCOUNTER — Encounter (HOSPITAL_COMMUNITY): Payer: Self-pay

## 2019-10-02 ENCOUNTER — Other Ambulatory Visit: Payer: Self-pay

## 2019-10-02 DIAGNOSIS — R002 Palpitations: Secondary | ICD-10-CM | POA: Insufficient documentation

## 2019-10-02 DIAGNOSIS — F419 Anxiety disorder, unspecified: Secondary | ICD-10-CM | POA: Insufficient documentation

## 2019-10-02 LAB — BASIC METABOLIC PANEL
Anion gap: 11 (ref 5–15)
BUN: 11 mg/dL (ref 6–20)
CO2: 27 mmol/L (ref 22–32)
Calcium: 9.3 mg/dL (ref 8.9–10.3)
Chloride: 101 mmol/L (ref 98–111)
Creatinine, Ser: 0.83 mg/dL (ref 0.61–1.24)
GFR calc Af Amer: >60 mL/min (ref >60)
GFR calc non Af Amer: >60 mL/min (ref >60)
Glucose, Bld: 86 mg/dL (ref 70–99)
Potassium: 3.9 mmol/L (ref 3.5–5.1)
Sodium: 139 mmol/L (ref 135–145)

## 2019-10-02 LAB — CBC
HCT: 45.7 % (ref 39.0–52.0)
Hemoglobin: 14.9 g/dL (ref 13.0–17.0)
MCH: 28.8 pg (ref 26.0–34.0)
MCHC: 32.6 g/dL (ref 30.0–36.0)
MCV: 88.4 fL (ref 80.0–100.0)
Platelets: 286 10*3/uL (ref 150–400)
RBC: 5.17 MIL/uL (ref 4.22–5.81)
RDW: 13.1 % (ref 11.5–15.5)
WBC: 6.4 10*3/uL (ref 4.0–10.5)
nRBC: 0 % (ref 0.0–0.2)

## 2019-10-02 MED ORDER — SODIUM CHLORIDE 0.9% FLUSH: 3.0000 mL | Freq: Once | INTRAVENOUS | Status: DC

## 2019-10-02 MED ORDER — SUCRALFATE 1 GM/10ML PO SUSP
1.0000 g | Freq: Three times a day (TID) | ORAL | 0 refills | Status: DC
Start: 2019-10-02 — End: 2019-11-30

## 2019-10-02 MED ORDER — HYDROXYZINE HCL 25 MG PO TABS
25.0000 mg | ORAL_TABLET | Freq: Four times a day (QID) | ORAL | 0 refills | Status: AC
Start: 2019-10-02 — End: 2019-10-23

## 2019-10-02 NOTE — Discharge Instructions (Addendum)
Please continue to wear your Zio patch and follow-up with your cardiologist.  Please call Monday to inform Dr. Carolanne Grumbling of your most recent ER visit. Please take Atarax as prescribed.  I have provided you with 3 weeks of this.  Please follow-up with your primary care doctor I have given you a phone number and address for the [facility], does not require insurance. Return to ED for any new or concerning symptoms otherwise follow-up with cardiology with for your continued symptoms.  Please follow-up on the psychiatric resource list that I have given you.  Please ignore the substance abuse information.   Letter from a primary care doctor or from the psychiatrist I recommend that you start a first-line anxiety medication such as an SSRI/SNRI.

## 2019-10-02 NOTE — ED Provider Notes (Signed)
[facility] DEPT Provider Note   CSN: [id number] Arrival date & time: 10/02/19  1322     History Chief Complaint  Patient presents with  . Palpitations  . Dizziness    Ashely Austin is a 29 y.o. male.  HPI  Patient is a 29 year old male with no pertinent past medical history apart from anxiety which he is not on medications for presented today for heart palpitations which has been ongoing for approximately 3 weeks.  He states that he has noticed intermittent palpitations that seem to come and go without any aggravating or mitigating factors.  He states that he feels like he skips a beat approximately 1 3 5  minutes.  He states he has been using his apple watch and saw that he had 1 irregular beat which concerned him.  He states that he has been recently evaluated by cardiology 5/26 and had a Zio patch placed at that time.  He states he was also given Vistaril for anxiety although he ran out of this prescription he states that it was improving his symptoms somewhat.  He denies any chest pain today but states that he intermittently has a burning sensation in his chest that can last 1 to 2 minutes it is nonexertional nonpleuritic.  He does endorse some associated shortness of breath when he feels a skipped beat but states that it is momentary for just 1 moment.  He denies any nausea, vomiting, fevers or chills.  My review of EMR patient was seen by Dr. Golden Hurter of cardiology 5/26 when he was fitted with a Zio patch.  At that time patient had symptoms that were believed to be consistent with reflux.  Sound similar to his symptoms today.  He does not take any medications for reflux.       Past Medical History:  Diagnosis Date  . Known health problems: none     Patient Active Problem List   Diagnosis Date Noted  . Known health problems: none     Past Surgical History:  Procedure Laterality Date  . no surgical history         Family  History  Problem Relation Age of Onset  . Cystic fibrosis Mother     Social History   Tobacco Use  . Smoking status: Never Smoker  . Smokeless tobacco: Never Used  Vaping Use  . Vaping Use: Never used  Substance Use Topics  . Alcohol use: Never  . Drug use: Never    Home Medications Prior to Admission medications   Medication Sig Start Date End Date Taking? Authorizing Provider  hydrOXYzine (ATARAX/VISTARIL) 25 MG tablet Take 1 tablet (25 mg total) by mouth every 6 (six) hours for 21 days. 10/02/19 10/23/19  Tedd Sias, PA  sucralfate (CARAFATE) 1 GM/10ML suspension Take 10 mLs (1 g total) by mouth 4 (four) times daily -  with meals and at bedtime. 10/02/19   Tedd Sias, PA    Allergies    Patient has no known allergies.  Review of Systems   Review of Systems  Constitutional: Negative for chills and fever.  HENT: Negative for congestion.   Eyes: Negative for pain.  Respiratory: Positive for shortness of breath. Negative for cough.   Cardiovascular: Positive for palpitations. Negative for chest pain and leg swelling.  Gastrointestinal: Negative for abdominal pain and vomiting.  Genitourinary: Negative for dysuria.  Musculoskeletal: Negative for myalgias.  Skin: Negative for rash.  Neurological: Negative for dizziness and headaches.  Psychiatric/Behavioral:  Positive for agitation. Negative for hallucinations, self-injury and suicidal ideas.    Physical Exam Updated Vital Signs BP 120/88   Pulse 74   Temp 97.9 F (36.6 C) (Oral)   Resp 16   Ht 5\' 7"  (1.702 m)   Wt 68 kg   SpO2 99%   BMI 23.49 kg/m   Physical Exam Vitals and nursing note reviewed.  Constitutional:      General: He is not in acute distress.    Comments: Pleasant 29 year old male appears stated age.  Somewhat tremulous, anxious appearing.  Able answer questions appropriately and follow my commands  HENT:     Head: Normocephalic and atraumatic.     Nose: Nose normal.  Eyes:      General: No scleral icterus. Cardiovascular:     Rate and Rhythm: Normal rate and regular rhythm.     Pulses: Normal pulses.     Heart sounds: Normal heart sounds.     Comments: Bilateral radial and DP pulses 3+ and symmetric.  Regular rate and rhythm.  No murmurs rubs or gallops Pulmonary:     Effort: Pulmonary effort is normal. No respiratory distress.     Breath sounds: No wheezing.     Comments: Lungs are clear in all fields.  No increased work of breathing.  Normal respiratory rate.  Speaking in full sentences Abdominal:     Palpations: Abdomen is soft.     Tenderness: There is no abdominal tenderness. There is no guarding or rebound.  Musculoskeletal:     Cervical back: Normal range of motion.     Right lower leg: No edema.     Left lower leg: No edema.     Comments: No calf tenderness.  Negative Homans sign  Skin:    General: Skin is warm and dry.     Capillary Refill: Capillary refill takes less than 2 seconds.  Neurological:     Mental Status: He is alert. Mental status is at baseline.  Psychiatric:        Mood and Affect: Mood normal.        Behavior: Behavior normal.     ED Results / Procedures / Treatments   Labs (all labs ordered are listed, but only abnormal results are displayed) Labs Reviewed  BASIC METABOLIC PANEL  CBC    EKG EKG Interpretation  Date/Time:  Saturday October 02 2019 13:36:40 EDT Ventricular Rate:  89 PR Interval:    QRS Duration: 92 QT Interval:  327 QTC Calculation: 398 R Axis:   77 Text Interpretation: Sinus arrhythmia Confirmed by 05-03-1973 [id number]) on 10/02/2019 5:31:14 PM   Radiology DG Chest 2 View  Result Date: 10/02/2019 CLINICAL DATA:  Heart palpitations for 3 weeks. EXAM: CHEST - 2 VIEW COMPARISON:  September 23, 2019 FINDINGS: Radiopaque device projects over the LEFT chest. Cardiomediastinal contours are stable. Lungs are clear. No sign of pleural effusion. No acute bone finding on limited assessment. IMPRESSION: No acute  cardiopulmonary disease. Electronically Signed   By: September 25, 2019 M.D.   On: 10/02/2019 14:28    Procedures Procedures (including critical care time)  Medications Ordered in ED Medications  sodium chloride flush (NS) 0.9 % injection 3 mL (has no administration in time range)    ED Course  I have reviewed the triage vital signs and the nursing notes.  Pertinent labs & imaging results that were available during my care of the patient were reviewed by me and considered in my medical decision making (see chart for  details).    MDM Rules/Calculators/A&P                          Patient is well-appearing 29 year old male presenting today with complaints of palpitations.  On my review of EMR he has been seen several times for this.  I personally reviewed patient's 2 prior ER visits as well as his cardiologist visit.  Chest pain currently.  He states he has regular palpitations and that approximately every 5 minutes he feels like he has a missed beat.  He shows me on his apple watch that he has a single PVC evident on the display.  He has had an EKG here that shows sinus arrhythmia variation with breathing no evidence of ischemia no ST or T wave abnormalities.  Is normal sinus rhythm.  Patient states his symptoms are not different today than they have in the past. He is scheduled to follow back up with cardiology and is currently wearing a Zio patch.  Patient has been wearing his Zio patch since late last month when he had it placed by Dr. Armanda Magic.   He is chest pain-free at this time.  Patient is PERC negative has no chest pain to indicate ACS, dissection, tamponade, pericarditis, myocarditis.  I doubt all of these and will obtain troponin at this time.  He has had negative dimer and troponins in the past for similar symptoms.  In fact his symptoms today are unchanged from prior.  The medical records were personally reviewed by myself. I personally reviewed all lab results and  interpreted all imaging studies and either concurred with their official read or contacted radiology for clarification. Additional history obtained from old records/EMS/family members.q  This patient appears reasonably screened and I doubt any other medical condition requiring further workup, evaluation, or treatment in the ED at this time prior to discharge.   Patient's vitals are WNL apart from vital sign abnormalities discussed above, patient is in NAD, and able to ambulate in the ED at their baseline and able to tolerate PO.  Pain has been managed or a plan has been made for home management and has no complaints prior to discharge. Patient is comfortable with above plan and for discharge at this time. All questions were answered prior to disposition. Results from the ER workup discussed with the patient face to face and all questions answered to the best of my ability. The patient is safe for discharge with strict return precautions. Patient appears safe for discharge with appropriate follow-up. Conveyed my impression with the patient and they voiced understanding and are agreeable to plan.   An After Visit Summary was printed and given to the patient.  Portions of this note were generated with Scientist, clinical (histocompatibility and immunogenetics). Dictation errors may occur despite best attempts at proofreading.     Patient discharged with hydroxyzine as he states he has had some improvement with this in the past.  Also given sucralfate as he does have occasional burning chest pain that sounds very suspicious for reflux.  Final Clinical Impression(s) / ED Diagnoses Final diagnoses:  Palpitations  Anxiety    Rx / DC Orders ED Discharge Orders         Ordered    hydrOXYzine (ATARAX/VISTARIL) 25 MG tablet  Every 6 hours     Discontinue  Reprint     10/02/19 1809    sucralfate (CARAFATE) 1 GM/10ML suspension  3 times daily with meals & bedtime  Discontinue  Reprint     10/02/19 1810           Gailen Shelter, [place] 10/02/19 Elesa Massed, MD 10/11/19 [phone]

## 2019-10-02 NOTE — ED Triage Notes (Signed)
Patient states he has been having heart palpitations x 3 weeks. Patient states "It was really bad today. I have had difficulty breathing and palpitations." patient denies any chest pain.  Patient is currently wearing a Holter Monitor.

## 2019-10-20 ENCOUNTER — Telehealth: Payer: Self-pay | Admitting: Cardiology

## 2019-10-20 NOTE — Telephone Encounter (Signed)
   Pt is calling heart monitor results. He said he sent it back to [facility] 2 weeks ago, advised no result yet and nurse will call once we get it from [facility] and Dr. Mayford Knife read it. Pt understood

## 2019-11-02 ENCOUNTER — Telehealth: Payer: Self-pay | Admitting: Cardiology

## 2019-11-02 ENCOUNTER — Telehealth: Payer: Self-pay

## 2019-11-02 DIAGNOSIS — R002 Palpitations: Secondary | ICD-10-CM

## 2019-11-02 NOTE — Telephone Encounter (Signed)
The patient has been notified of the result and verbalized understanding.  All questions (if any) were answered. Theresia Majors, RN 11/02/2019 5:05 PM  Orders for home sleep study and echocardiogram have been ordered.  Patient also states that he has not been having PVC's at rest for about a month now but they do occur during exercise.  He also states that about two years ago he had an episode of passing out.

## 2019-11-02 NOTE — Telephone Encounter (Signed)
-----   Message from Quintella Reichert, MD sent at 11/01/2019  5:14 PM EDT ----- Heart monitor showed normal rhythm with occasional extra heart beats from the top and bottom of heart which are usually benign.  H did have some dropped beats during sleep which are normal but could indicate underlying OSA.  Please get a home sleep study for nocturnal second degree AV block. Also get a 2D echo to assess LVF

## 2019-11-02 NOTE — Telephone Encounter (Signed)
The patient has been notified of the result and verbalized understanding.  All questions (if any) were answered. Theresia Majors, RN 11/02/2019 5:07 PM

## 2019-11-02 NOTE — Telephone Encounter (Signed)
Follow Up:     Pt is returning your call,

## 2019-11-02 NOTE — Telephone Encounter (Signed)
Left message for patient to call back  

## 2019-11-02 NOTE — Telephone Encounter (Signed)
     I went in pt's chart to see who called him about his results. It was Kinnie Feil., I sent her a telephone encounter

## 2019-11-02 NOTE — Telephone Encounter (Signed)
Follow Up:     Pt was Carlyle's call, concerning his Monitor results.

## 2019-11-09 ENCOUNTER — Telehealth: Payer: Self-pay | Admitting: Cardiology

## 2019-11-09 NOTE — Telephone Encounter (Signed)
° ° °  Went to chart to check who called pt. No notes on file °

## 2019-11-18 ENCOUNTER — Ambulatory Visit (HOSPITAL_COMMUNITY): Payer: 59 | Attending: Cardiovascular Disease

## 2019-11-18 ENCOUNTER — Other Ambulatory Visit: Payer: Self-pay

## 2019-11-18 DIAGNOSIS — R002 Palpitations: Secondary | ICD-10-CM

## 2019-11-18 LAB — ECHOCARDIOGRAM COMPLETE
Area-P 1/2: 3.99 cm2
S' Lateral: 2.9 cm

## 2019-11-19 ENCOUNTER — Telehealth: Payer: Self-pay

## 2019-11-19 DIAGNOSIS — I442 Atrioventricular block, complete: Secondary | ICD-10-CM

## 2019-11-19 DIAGNOSIS — R002 Palpitations: Secondary | ICD-10-CM

## 2019-11-19 NOTE — Telephone Encounter (Signed)
Patient returned my call in regards to echocardiogram results. The patient has been notified of the result and verbalized understanding.  All questions (if any) were answered. Patient also would like for Dr. Mayford Knife to know that he has passed out twice over the past two weeks. He went to an urgent care after the first episode and they were unable to find a cause. Patient states that he hurt is hand but no other injuries occurred. He states that he did not have any symptoms before or after the episodes. He states that he is staying hydrated. He reports that he is still having some palpitations but they are not as frequent.

## 2019-11-19 NOTE — Telephone Encounter (Signed)
Please get him in with EP Dr. Ladona Ridgel or Dr. Elberta Fortis next week for possible loop recorder.  2D echo is normal and he had transient heart block while sleeping on mointor

## 2019-11-22 NOTE — Addendum Note (Signed)
Addended by: Theresia Majors on: 11/22/2019 03:33 PM   Modules accepted: Orders

## 2019-11-22 NOTE — Telephone Encounter (Signed)
Referral has been placed. 

## 2019-11-30 ENCOUNTER — Ambulatory Visit (INDEPENDENT_AMBULATORY_CARE_PROVIDER_SITE_OTHER): Payer: 59 | Admitting: Internal Medicine

## 2019-11-30 ENCOUNTER — Other Ambulatory Visit: Payer: Self-pay

## 2019-11-30 ENCOUNTER — Encounter: Payer: Self-pay | Admitting: Internal Medicine

## 2019-11-30 VITALS — BP 120/78 | HR 75 | Ht 67.0 in | Wt 158.0 lb

## 2019-11-30 DIAGNOSIS — R55 Syncope and collapse: Secondary | ICD-10-CM | POA: Diagnosis not present

## 2019-11-30 DIAGNOSIS — R002 Palpitations: Secondary | ICD-10-CM

## 2019-11-30 HISTORY — PX: OTHER SURGICAL HISTORY: SHX169

## 2019-11-30 NOTE — Progress Notes (Signed)
Electrophysiology Office Note   Date:  11/30/2019   ID:  Frans, Valente 1990-07-24, MRN [id number]  PCP:  Patient, No Pcp Per  Cardiologist:  Dr Mayford Knife Primary Electrophysiologist: Hillis Range, MD    CC: palpitations   History of Present Illness: Lucas Austin is a 29 y.o. male who presents today for electrophysiology evaluation.   He is referred by Dr Mayford Knife for EP evaluation of palpitations. The patient has intermittent palpitations of unclear etiology.  He has been evaluated by Dr Mayford Knife and had event monitor which showed rare PACs, PVCs.  Symptoms did not correlate to an arrhythmia.  He has had ED visits for palpitations.  He had syncope 2 years ago while seated at work.  He reports abrupt onset, without prodrome.  He thinks that he had LOC for about 30 seconds.  He did not seek medical attention. He has had 2 subsequent syncopal events over the past few weeks.  One episode occurred while seated in a parked car.  The second episode occurred while walking in the woods.  He did not have any warning.   Today, he denies symptoms of chest pain, shortness of breath,  Dizziness, or neurologic sequela. The patient is tolerating medications without difficulties and is otherwise without complaint today.    Past Medical History:  Diagnosis Date  . Known health problems: none   . PVC's (premature ventricular contractions)    Past Surgical History:  Procedure Laterality Date  . no surgical history       No current outpatient medications on file.   No current facility-administered medications for this visit.    Allergies:   Patient has no known allergies.   Social History:  The patient  reports that he has never smoked. He has never used smokeless tobacco. He reports that he does not drink alcohol and does not use drugs.   Family History:  The patient's  family history includes Cystic fibrosis in his mother.  Maternal father died of an aneurysm.  No other FH  of sudden death.  No FH of arrhythmias.  ROS:  Please see the history of present illness.   All other systems are personally reviewed and negative.    PHYSICAL EXAM: VS:  BP 120/78   Pulse 75   Ht 5\' 7"  (1.702 m)   Wt 158 lb (71.7 kg)   SpO2 97%   BMI 24.75 kg/m  , BMI Body mass index is 24.75 kg/m. GEN: Well nourished, well developed, in no acute distress HEENT: normal Neck: no JVD,  Cardiac: RRR  Respiratory:   normal work of breathing GI: soft  MS: no deformity or atrophy Skin: warm and dry  Neuro:  Strength and sensation are intact Psych: anxious appearing  EKG:  EKG is ordered today. The ekg ordered today is personally reviewed and shows sinus, normal ekg   Recent Labs: 09/12/2019: ALT 24; TSH 2.272 09/14/2019: Magnesium 2.1 10/02/2019: BUN 11; Creatinine, Ser 0.83; Hemoglobin 14.9; Platelets 286; Potassium 3.9; Sodium 139  personally reviewed   Lipid Panel  No results found for: CHOL, TRIG, HDL, CHOLHDL, VLDL, LDLCALC, LDLDIRECT personally reviewed   Wt Readings from Last 3 Encounters:  11/30/19 158 lb (71.7 kg)  10/02/19 150 lb (68 kg)  09/15/19 159 lb (72.1 kg)    Echo- EF 60%, mild LA enlargement, normal RV size and function Event Monitor 10/26/19- sinus rhythm, rare PACs, rare PVCs Nocturnal bradycardia noted.  Symptoms did not correlate to any arrhythmias  Other  studies personally reviewed: Additional studies/ records that were reviewed today include: Dr Malachy Mood notes,   Review of the above records today demonstrates: As above   ASSESSMENT AND PLAN:  1.  Palpitations Appear to be due to PVCs I have reviewed apple watch strips which confirm PVCs.  These are not closely coupled to the proceeding t wave. We discussed lifestyle modification at length.  Regular exercise, adequate sleep and stress avoidance are encouraged  2. Recurrent unexplained syncope Unclear etiology Echo is normal ekg is normal Event monitor was low risk Symptoms are concerning.   He has a class I indication for an ILR implant for long term monitoring of his afib. Risks, benefits and alternatives to ILR implant were discussed with the patient who wishes to proceed.  He understands that risks including but are not limited to bleeding and infection.  No driving x 6 months post syncope   Follow-up:  With Dr Mayford Knife as scheduled  Current medicines are reviewed at length with the patient today.   The patient does not have concerns regarding his medicines.  The following changes were made today:  none  Labs/ tests ordered today include:  Orders Placed This Encounter  Procedures  . EKG 12-Lead     Signed, Hillis Range, MD  11/30/2019 12:42 PM     [facility] [address] [place] [address] [phone] (office) [phone] (fax)   DESCRIPTION OF PROCEDURE:  Informed written consent was obtained.  The patient required no sedation for the procedure today.  The patients left chest was prepped and draped. Mapping over the patient's chest was performed to identify the appropriate ILR site.  This area was found to be the left parasternal region over the 3rd-4th intercostal space.  The skin overlying this region was infiltrated with lidocaine for local analgesia.  A 0.5-cm incision was made at the implant site.  A subcutaneous ILR pocket was fashioned using a combination of sharp and blunt dissection.  A Medtronic Reveal Linq model [id number] implantable loop recorder (SN [id number]) was then placed into the pocket R waves were very prominent and measured > 0.2 mV. EBL<1 ml.  Steri- Strips and a sterile dressing were then applied.  There were no early apparent complications.     CONCLUSIONS:   1. Successful implantation of a Medtronic Reveal LINQ implantable loop recorder for evaluation of recurrent unexplained syncope  2. No early apparent complications.   Hillis Range MD, [facility] 11/30/2019 12:45 PM

## 2019-11-30 NOTE — Patient Instructions (Addendum)
Medication Instructions:  Your physician recommends that you continue on your current medications as directed. Please refer to the Current Medication list given to you today.  Labwork: None ordered.  Testing/Procedures: None ordered.  Follow-Up: Your physician wants you to follow-up in: 6 months with Graciella Freer, PA-C. You will receive a reminder letter in the mail two months in advance. If you don't receive a letter, please call our office to schedule the follow-up appointment.    Implantable Loop Recorder Placement, Care After This sheet gives you information about how to care for yourself after your procedure. Your health care provider may also give you more specific instructions. If you have problems or questions, contact your health care provider. What can I expect after the procedure? After the procedure, it is common to have:  Soreness or discomfort near the incision.  Some swelling or bruising near the incision.  Follow these instructions at home: Incision care  1.  Leave your outer dressing on for 24 hours.  After 24 hours you can remove your outer dressing and shower. 2. Leave adhesive strips in place. These skin closures may need to stay in place for 1-2 weeks. If adhesive strip edges start to loosen and curl up, you may trim the loose edges.  You may remove the strips if they have not fallen off after 2 weeks. 3. Check your incision area every day for signs of infection. Check for: a. Redness, swelling, or pain. b. Fluid or blood. c. Warmth. d. Pus or a bad smell. 4. Do not take baths, swim, or use a hot tub until your incision is completely healed. 5. If your wound site starts to bleed apply pressure.      If you have any questions/concerns please call the device clinic at [phone].  Activity  Return to your normal activities.  General instructions  Follow instructions from your health care provider about how to manage your implantable loop  recorder and transmit the information. Learn how to activate a recording if this is necessary for your type of device.  Do not go through a metal detection gate, and do not let someone hold a metal detector over your chest. Show your ID card.  Do not have an MRI unless you check with your health care provider first.  Take over-the-counter and prescription medicines only as told by your health care provider.  Keep all follow-up visits as told by your health care provider. This is important. Contact a health care provider if:  You have redness, swelling, or pain around your incision.  You have a fever.  You have pain that is not relieved by your pain medicine.  You have triggered your device because of fainting (syncope) or because of a heartbeat that feels like it is racing, slow, fluttering, or skipping (palpitations). Get help right away if you have:  Chest pain.  Difficulty breathing. Summary  After the procedure, it is common to have soreness or discomfort near the incision.  Change your dressing as told by your health care provider.  Follow instructions from your health care provider about how to manage your implantable loop recorder and transmit the information.  Keep all follow-up visits as told by your health care provider. This is important. This information is not intended to replace advice given to you by your health care provider. Make sure you discuss any questions you have with your health care provider. Document Released: 03/20/2015 Document Revised: 05/24/2017 Document Reviewed: 05/24/2017 Elsevier Patient Education  2020  [employer].

## 2020-01-03 ENCOUNTER — Ambulatory Visit (INDEPENDENT_AMBULATORY_CARE_PROVIDER_SITE_OTHER): Payer: 59 | Admitting: *Deleted

## 2020-01-03 DIAGNOSIS — R55 Syncope and collapse: Secondary | ICD-10-CM

## 2020-01-03 LAB — CUP PACEART REMOTE DEVICE CHECK
Date Time Interrogation Session: 20210912145134
Implantable Pulse Generator Implant Date: 20210810

## 2020-01-05 NOTE — Progress Notes (Signed)
Carelink Summary Report / Loop Recorder 

## 2020-02-05 LAB — CUP PACEART REMOTE DEVICE CHECK
Date Time Interrogation Session: 20211015145200
Implantable Pulse Generator Implant Date: 20210810

## 2020-02-07 ENCOUNTER — Ambulatory Visit (INDEPENDENT_AMBULATORY_CARE_PROVIDER_SITE_OTHER): Payer: 59

## 2020-02-07 DIAGNOSIS — R55 Syncope and collapse: Secondary | ICD-10-CM | POA: Diagnosis not present

## 2020-02-09 NOTE — Progress Notes (Signed)
Carelink Summary Report / Loop Recorder 

## 2020-03-08 LAB — CUP PACEART REMOTE DEVICE CHECK
Date Time Interrogation Session: 20211117145240
Implantable Pulse Generator Implant Date: 20210810

## 2020-03-13 ENCOUNTER — Ambulatory Visit (INDEPENDENT_AMBULATORY_CARE_PROVIDER_SITE_OTHER): Payer: 59

## 2020-03-13 DIAGNOSIS — R002 Palpitations: Secondary | ICD-10-CM

## 2020-03-14 NOTE — Progress Notes (Signed)
Carelink Summary Report / Loop Recorder 

## 2020-04-10 ENCOUNTER — Ambulatory Visit (INDEPENDENT_AMBULATORY_CARE_PROVIDER_SITE_OTHER): Payer: 59

## 2020-04-10 DIAGNOSIS — R002 Palpitations: Secondary | ICD-10-CM

## 2020-04-10 LAB — CUP PACEART REMOTE DEVICE CHECK
Date Time Interrogation Session: 20211220144729
Implantable Pulse Generator Implant Date: 20210810

## 2020-04-24 NOTE — Progress Notes (Signed)
Carelink Summary Report / Loop Recorder 

## 2020-04-26 NOTE — Addendum Note (Signed)
Addended by: Geralyn Flash D on: 04/26/2020 12:34 PM   Modules accepted: Level of Service

## 2020-05-14 LAB — CUP PACEART REMOTE DEVICE CHECK
Date Time Interrogation Session: 20220122145251
Implantable Pulse Generator Implant Date: 20210810

## 2020-05-15 ENCOUNTER — Ambulatory Visit (INDEPENDENT_AMBULATORY_CARE_PROVIDER_SITE_OTHER): Payer: 59

## 2020-05-15 DIAGNOSIS — R55 Syncope and collapse: Secondary | ICD-10-CM

## 2020-05-25 NOTE — Progress Notes (Signed)
Carelink Summary Report / Loop Recorder 

## 2020-06-16 LAB — CUP PACEART REMOTE DEVICE CHECK
Date Time Interrogation Session: 20220224145123
Implantable Pulse Generator Implant Date: 20210810

## 2020-06-19 ENCOUNTER — Ambulatory Visit (INDEPENDENT_AMBULATORY_CARE_PROVIDER_SITE_OTHER): Payer: 59

## 2020-06-19 DIAGNOSIS — R55 Syncope and collapse: Secondary | ICD-10-CM

## 2020-06-26 NOTE — Progress Notes (Signed)
Carelink Summary Report / Loop Recorder 

## 2020-07-19 ENCOUNTER — Ambulatory Visit (INDEPENDENT_AMBULATORY_CARE_PROVIDER_SITE_OTHER): Payer: 59

## 2020-07-19 DIAGNOSIS — R55 Syncope and collapse: Secondary | ICD-10-CM | POA: Diagnosis not present

## 2020-07-19 LAB — CUP PACEART REMOTE DEVICE CHECK
Date Time Interrogation Session: 20220329144950
Implantable Pulse Generator Implant Date: 20210810

## 2020-08-01 NOTE — Progress Notes (Signed)
Carelink Summary Report / Loop Recorder 

## 2020-08-03 ENCOUNTER — Telehealth: Payer: Self-pay | Admitting: Cardiology

## 2020-08-03 NOTE — Telephone Encounter (Signed)
Attempted to call pt back and call was directly sent to VM.  I am unable to determine who called him yesterday.  There are no new results, no new appts, no appt reminders for upcoming appt.  No documentation that anyone tried to contact him yesterday.

## 2020-08-03 NOTE — Telephone Encounter (Signed)
Pt states he received a phone call yesterday around 3:30pm and he's not sure who called because no message was left. Please advise

## 2020-08-15 ENCOUNTER — Encounter: Payer: Self-pay | Admitting: Student

## 2020-08-15 ENCOUNTER — Ambulatory Visit (INDEPENDENT_AMBULATORY_CARE_PROVIDER_SITE_OTHER): Payer: 59 | Admitting: Student

## 2020-08-15 ENCOUNTER — Other Ambulatory Visit: Payer: Self-pay

## 2020-08-15 VITALS — BP 118/68 | HR 90 | Ht 67.0 in | Wt 164.0 lb

## 2020-08-15 DIAGNOSIS — R55 Syncope and collapse: Secondary | ICD-10-CM | POA: Diagnosis not present

## 2020-08-15 DIAGNOSIS — R002 Palpitations: Secondary | ICD-10-CM

## 2020-08-15 NOTE — Progress Notes (Signed)
Electrophysiology Office Note Date: 08/15/2020  ID:  Lucas Austin 07-Feb-1991, MRN [id number]  PCP: Patient, No Pcp Per (Inactive) Primary Cardiologist: Armanda Magic, MD Electrophysiologist: Hillis Range, MD   CC: ILR follow-up  Lucas Austin is a 30 y.o. male seen today for Dr. Johney Frame . he presents today for routine electrophysiology followup.  Since last being seen in our clinic, the patient reports doing very well. He has not had any further syncope, nor does he feel palpitations. he denies chest pain, dyspnea, PND, orthopnea, nausea, vomiting, dizziness, edema, weight gain, or early satiety.  Device History: Medtronic loop recorder implanted 11/2019 for palpitations and recurrent unexplained syncope.   Past Medical History:  Diagnosis Date  . Known health problems: none   . PVC's (premature ventricular contractions)    Past Surgical History:  Procedure Laterality Date  . implantable loop recorder placement  11/30/2019   Medtronic Reveal Linq model [id number] implantable loop recorder (SN [id number]) by Dr Johney Frame for syncope evaluation  . no surgical history      No current outpatient medications on file.   No current facility-administered medications for this visit.    Allergies:   Patient has no known allergies.   Social History: Social History   Socioeconomic History  . Marital status: Married    Spouse name: Not on file  . Number of children: Not on file  . Years of education: Not on file  . Highest education level: Not on file  Occupational History  . Not on file  Tobacco Use  . Smoking status: Never Smoker  . Smokeless tobacco: Never Used  Vaping Use  . Vaping Use: Never used  Substance and Sexual Activity  . Alcohol use: Never  . Drug use: Never  . Sexual activity: Not on file  Other Topics Concern  . Not on file  Social History Narrative   Lives with wife in [place]   Works in Systems developer   Social Determinants of Health    Financial Resource Strain: Not on file  Food Insecurity: Not on file  Transportation Needs: Not on file  Physical Activity: Not on file  Stress: Not on file  Social Connections: Not on file  Intimate Partner Violence: Not on file    Family History: Family History  Problem Relation Age of Onset  . Cystic fibrosis Mother      Review of Systems: All other systems reviewed and are otherwise negative except as noted above.  Physical Exam: There were no vitals filed for this visit.   GEN- The patient is well appearing, alert and oriented x 3 today.   HEENT: normocephalic, atraumatic; sclera clear, conjunctiva pink; hearing intact; oropharynx clear; neck supple  Lungs- Clear to ausculation bilaterally, normal work of breathing.  No wheezes, rales, rhonchi Heart- Regular rate and rhythm, no murmurs, rubs or gallops  GI- soft, non-tender, non-distended, bowel sounds present  Extremities- no clubbing, cyanosis, or edema  MS- no significant deformity or atrophy Skin- warm and dry, no rash or lesion; ILR pocket well healed Psych- euthymic mood, full affect Neuro- strength and sensation are intact  ILR Interrogation- Daily remote transmission reviewed from this am. No episodes.  EKG:  EKG is ordered today. The ekg ordered today shows NSR at 90 bpm  Recent Labs: 09/12/2019: ALT 24; TSH 2.272 09/14/2019: Magnesium 2.1 10/02/2019: BUN 11; Creatinine, Ser 0.83; Hemoglobin 14.9; Platelets 286; Potassium 3.9; Sodium 139   Wt Readings from Last 3 Encounters:  11/30/19 158 lb (  71.7 kg)  10/02/19 150 lb (68 kg)  09/15/19 159 lb (72.1 kg)     Other studies Reviewed: Additional studies/ records that were reviewed today include: Previous EP notes  Assessment and Plan:  1. Syncope s/p Medtronic Loop recorder Normal device function See Pace Art report No changes today  2. Palpitations Essentially resolved with lifestyle modification  3. ?OSA He was recommended for a home sleep  study with nocturnal AV block on previous monitoring.  We will reach back out about having this done.   Current medicines are reviewed at length with the patient today.   The patient does not have concerns regarding his medicines.  The following changes were made today:  none  Labs/ tests ordered today include:  No orders of the defined types were placed in this encounter.    Disposition:   Follow up with Dr. Johney Frame in 6 months. Sooner with issues, can possible increase follow up at that point pending no further issues on monitoring.     Traci, Plemons, PA-C  08/15/2020 1:10 PM  [facility] [address] [place] [address] [phone] (office) [phone] (fax)

## 2020-08-15 NOTE — Patient Instructions (Signed)
Medication Instructions:  Your physician recommends that you continue on your current medications as directed. Please refer to the Current Medication list given to you today.  *If you need a refill on your cardiac medications before your next appointment, please call your pharmacy*   Lab Work: None If you have labs (blood work) drawn today and your tests are completely normal, you will receive your results only by: . MyChart Message (if you have MyChart) OR . A paper copy in the mail If you have any lab test that is abnormal or we need to change your treatment, we will call you to review the results.  Follow-Up: At [facility], you and your health needs are our priority.  As part of our continuing mission to provide you with exceptional heart care, we have created designated Provider Care Teams.  These Care Teams include your primary Cardiologist (physician) and Advanced Practice Providers (APPs -  Physician Assistants and Nurse Practitioners) who all work together to provide you with the care you need, when you need it.  Your next appointment:   6 month(s)  The format for your next appointment:   In Person  Provider:   You may see James Allred, MD or one of the following Advanced Practice Providers on your designated Care Team:    Amber Seiler, NP  Renee Ursuy, PA-C  Yojan "Andy" Tillery, PA-C    

## 2020-08-16 ENCOUNTER — Telehealth: Payer: Self-pay | Admitting: *Deleted

## 2020-08-16 NOTE — Telephone Encounter (Signed)
Home sleep study sent to precert for PA.

## 2020-08-17 ENCOUNTER — Telehealth: Payer: Self-pay | Admitting: *Deleted

## 2020-08-17 NOTE — Telephone Encounter (Signed)
Prior Authorization for [employer] sent to [employer] via Phone. No PA is required. Reference # [id number].

## 2020-08-17 NOTE — Telephone Encounter (Signed)
-----   Message from Reesa Chew, CMA sent at 08/16/2020  4:21 PM EDT ----- Regarding: precert Per dr Mayford Knife Orders for home sleep study have been ordered.

## 2020-08-21 ENCOUNTER — Ambulatory Visit (INDEPENDENT_AMBULATORY_CARE_PROVIDER_SITE_OTHER): Payer: 59

## 2020-08-21 DIAGNOSIS — R55 Syncope and collapse: Secondary | ICD-10-CM

## 2020-08-21 LAB — CUP PACEART REMOTE DEVICE CHECK
Date Time Interrogation Session: 20220501145050
Implantable Pulse Generator Implant Date: 20210810

## 2020-09-08 NOTE — Progress Notes (Signed)
Carelink Summary Report / Loop Recorder 

## 2020-09-11 NOTE — Telephone Encounter (Signed)
Informed patient of upcoming home sleep study and patient understanding was verbalized.  Patient understands her/his HST is scheduled for 09-27-20 at 12.  Pt is aware of testing date.

## 2020-09-24 LAB — CUP PACEART REMOTE DEVICE CHECK
Date Time Interrogation Session: 20220603145204
Implantable Pulse Generator Implant Date: 20210810

## 2020-09-25 ENCOUNTER — Ambulatory Visit (INDEPENDENT_AMBULATORY_CARE_PROVIDER_SITE_OTHER): Payer: 59

## 2020-09-25 DIAGNOSIS — R55 Syncope and collapse: Secondary | ICD-10-CM

## 2020-09-27 ENCOUNTER — Other Ambulatory Visit: Payer: Self-pay

## 2020-09-27 ENCOUNTER — Ambulatory Visit (HOSPITAL_BASED_OUTPATIENT_CLINIC_OR_DEPARTMENT_OTHER): Payer: 59

## 2020-09-28 NOTE — Progress Notes (Signed)
This encounter was created in error - please disregard.

## 2020-09-28 NOTE — Procedures (Signed)
Erroneous encounter

## 2020-10-16 NOTE — Progress Notes (Signed)
Carelink Summary Report / Loop Recorder 

## 2020-10-29 LAB — CUP PACEART REMOTE DEVICE CHECK
Date Time Interrogation Session: 20220706145253
Implantable Pulse Generator Implant Date: 20210810

## 2020-10-30 ENCOUNTER — Ambulatory Visit (INDEPENDENT_AMBULATORY_CARE_PROVIDER_SITE_OTHER): Payer: 59

## 2020-10-30 DIAGNOSIS — R55 Syncope and collapse: Secondary | ICD-10-CM | POA: Diagnosis not present

## 2020-11-20 NOTE — Progress Notes (Signed)
Carelink Summary Report / Loop Recorder 

## 2020-12-04 ENCOUNTER — Ambulatory Visit (INDEPENDENT_AMBULATORY_CARE_PROVIDER_SITE_OTHER): Payer: 59

## 2020-12-04 DIAGNOSIS — R55 Syncope and collapse: Secondary | ICD-10-CM | POA: Diagnosis not present

## 2020-12-04 LAB — CUP PACEART REMOTE DEVICE CHECK
Date Time Interrogation Session: 20220808144907
Implantable Pulse Generator Implant Date: 20210810

## 2020-12-22 NOTE — Progress Notes (Signed)
Carelink Summary Report / Loop Recorder 

## 2021-01-08 ENCOUNTER — Ambulatory Visit (INDEPENDENT_AMBULATORY_CARE_PROVIDER_SITE_OTHER): Payer: 59

## 2021-01-08 DIAGNOSIS — R55 Syncope and collapse: Secondary | ICD-10-CM

## 2021-01-09 LAB — CUP PACEART REMOTE DEVICE CHECK
Date Time Interrogation Session: 20220910145126
Implantable Pulse Generator Implant Date: 20210810

## 2021-01-12 NOTE — Progress Notes (Signed)
Carelink Summary Report / Loop Recorder 

## 2021-02-05 ENCOUNTER — Ambulatory Visit (INDEPENDENT_AMBULATORY_CARE_PROVIDER_SITE_OTHER): Payer: 59

## 2021-02-05 DIAGNOSIS — R55 Syncope and collapse: Secondary | ICD-10-CM

## 2021-02-06 LAB — CUP PACEART REMOTE DEVICE CHECK
Date Time Interrogation Session: 20221013144721
Implantable Pulse Generator Implant Date: 20210810

## 2021-02-13 NOTE — Progress Notes (Signed)
Carelink Summary Report / Loop Recorder 

## 2021-02-14 NOTE — Addendum Note (Signed)
Addended by: Geralyn Flash D on: 02/14/2021 09:43 AM   Modules accepted: Level of Service

## 2021-02-26 ENCOUNTER — Emergency Department (HOSPITAL_COMMUNITY): Admission: EM | Admit: 2021-02-26 | Discharge: 2021-02-26 | Discharge status: Against Medical Advice | Payer: 59

## 2021-02-26 NOTE — ED Triage Notes (Signed)
Patient arrived by [facility] with complaint of SOB x 1 hour. Speaking complete sentences, alert and oriented

## 2021-03-07 LAB — CUP PACEART REMOTE DEVICE CHECK
Date Time Interrogation Session: 20221115145109
Implantable Pulse Generator Implant Date: 20210810

## 2021-03-12 ENCOUNTER — Ambulatory Visit (INDEPENDENT_AMBULATORY_CARE_PROVIDER_SITE_OTHER): Payer: 59

## 2021-03-12 DIAGNOSIS — R55 Syncope and collapse: Secondary | ICD-10-CM

## 2021-03-20 NOTE — Progress Notes (Signed)
Carelink Summary Report / Loop Recorder 

## 2021-04-06 ENCOUNTER — Encounter (HOSPITAL_BASED_OUTPATIENT_CLINIC_OR_DEPARTMENT_OTHER): Payer: Self-pay | Admitting: Internal Medicine

## 2021-04-06 ENCOUNTER — Ambulatory Visit (INDEPENDENT_AMBULATORY_CARE_PROVIDER_SITE_OTHER): Payer: 59 | Admitting: Internal Medicine

## 2021-04-06 ENCOUNTER — Other Ambulatory Visit: Payer: Self-pay

## 2021-04-06 VITALS — BP 124/72 | HR 99 | Ht 67.0 in | Wt 157.0 lb

## 2021-04-06 DIAGNOSIS — R002 Palpitations: Secondary | ICD-10-CM

## 2021-04-06 DIAGNOSIS — R55 Syncope and collapse: Secondary | ICD-10-CM

## 2021-04-06 NOTE — Progress Notes (Signed)
° °  PCP: Patient, No Pcp Per (Inactive) Primary Cardiologist: Dr Mayford Knife Primary EP: Dr Johney Frame  Lucas Austin is a 30 y.o. male who presents today for routine electrophysiology followup.  Since last being seen in our clinic, the patient reports doing very well.  Today, he denies symptoms of palpitations, chest pain, shortness of breath,  lower extremity edema, dizziness, presyncope, or syncope.  The patient is otherwise without complaint today.   Past Medical History:  Diagnosis Date   Known health problems: none    PVC's (premature ventricular contractions)    Past Surgical History:  Procedure Laterality Date   implantable loop recorder placement  11/30/2019   Medtronic Reveal Linq model [id number] implantable loop recorder (SN [id number]) by Dr Johney Frame for syncope evaluation   no surgical history      ROS- all systems are reviewed and negatives except as per HPI above  No current outpatient medications on file.   No current facility-administered medications for this visit.    Physical Exam: Vitals:   04/06/21 1555  BP: 124/72  Pulse: 99  SpO2: 98%  Weight: 157 lb (71.2 kg)  Height: 5\' 7"  (1.702 m)    GEN- The patient is well appearing, alert and oriented x 3 today.   Head- normocephalic, atraumatic Eyes-  Sclera clear, conjunctiva pink Ears- hearing intact Oropharynx- clear Lungs- Clear to ausculation bilaterally, normal work of breathing Heart- Regular rate and rhythm, no murmurs, rubs or gallops, PMI not laterally displaced GI- soft, NT, ND, + BS Extremities- no clubbing, cyanosis, or edema  Wt Readings from Last 3 Encounters:  04/06/21 157 lb (71.2 kg)  09/27/20 158 lb (71.7 kg)  08/15/20 164 lb (74.4 kg)    EKG tracing ordered today is personally reviewed and shows sinus  Assessment and Plan:  Palpitations Due to PVCs ILR reviewed PVC burden is 0.3%  2. Recurrent unexplained syncope No arrhythmias by ILR to explain  Risks, benefits and potential  toxicities for medications prescribed and/or refilled reviewed with patient today.   08/17/20 MD, [place] 04/06/2021 4:03 PM

## 2021-04-06 NOTE — Patient Instructions (Addendum)
Medication Instructions:  °Your physician recommends that you continue on your current medications as directed. Please refer to the Current Medication list given to you today. °*If you need a refill on your cardiac medications before your next appointment, please call your pharmacy* ° °Lab Work: °None. °If you have labs (blood work) drawn today and your tests are completely normal, you will receive your results only by: °MyChart Message (if you have MyChart) OR °A paper copy in the mail °If you have any lab test that is abnormal or we need to change your treatment, we will call you to review the results. ° °Testing/Procedures: °None. ° °Follow-Up: °At [facility], you and your health needs are our priority.  As part of our continuing mission to provide you with exceptional heart care, we have created designated Provider Care Teams.  These Care Teams include your primary Cardiologist (physician) and Advanced Practice Providers (APPs -  Physician Assistants and Nurse Practitioners) who all work together to provide you with the care you need, when you need it. ° °Your physician wants you to follow-up in: 12 months with   °one of the following Advanced Practice Providers on your designated Care Team:   ° ° °Izel "Andy" Tillery, PA-C °  You will receive a reminder letter in the mail two months in advance. If you don't receive a letter, please call our office to schedule the follow-up appointment. ° °We recommend signing up for the patient portal called "MyChart".  Sign up information is provided on this After Visit Summary.  MyChart is used to connect with patients for Virtual Visits (Telemedicine).  Patients are able to view lab/test results, encounter notes, upcoming appointments, etc.  Non-urgent messages can be sent to your provider as well.   °To learn more about what you can do with MyChart, go to [email or web address].com.   ° °Any Other Special Instructions Will Be Listed Below (If Applicable). ° ° ° ° °   ° ° °

## 2021-04-10 LAB — CUP PACEART REMOTE DEVICE CHECK
Date Time Interrogation Session: 20221218145004
Implantable Pulse Generator Implant Date: 20210810

## 2021-04-18 ENCOUNTER — Ambulatory Visit (INDEPENDENT_AMBULATORY_CARE_PROVIDER_SITE_OTHER): Payer: 59

## 2021-04-18 DIAGNOSIS — R002 Palpitations: Secondary | ICD-10-CM | POA: Diagnosis not present

## 2021-04-30 NOTE — Progress Notes (Signed)
Carelink Summary Report / Loop Recorder 

## 2021-05-21 ENCOUNTER — Ambulatory Visit (INDEPENDENT_AMBULATORY_CARE_PROVIDER_SITE_OTHER): Payer: 59

## 2021-05-21 DIAGNOSIS — R55 Syncope and collapse: Secondary | ICD-10-CM | POA: Diagnosis not present

## 2021-05-21 LAB — CUP PACEART REMOTE DEVICE CHECK
Date Time Interrogation Session: 20230129231938
Implantable Pulse Generator Implant Date: 20210810

## 2021-05-28 NOTE — Progress Notes (Signed)
Carelink Summary Report / Loop Recorder 

## 2021-06-25 ENCOUNTER — Ambulatory Visit (INDEPENDENT_AMBULATORY_CARE_PROVIDER_SITE_OTHER): Payer: 59

## 2021-06-25 DIAGNOSIS — R55 Syncope and collapse: Secondary | ICD-10-CM | POA: Diagnosis not present

## 2021-06-25 LAB — CUP PACEART REMOTE DEVICE CHECK
Date Time Interrogation Session: 20230303230354
Implantable Pulse Generator Implant Date: 20210810

## 2021-07-03 NOTE — Progress Notes (Signed)
Carelink Summary Report / Loop Recorder 

## 2021-07-30 ENCOUNTER — Ambulatory Visit (INDEPENDENT_AMBULATORY_CARE_PROVIDER_SITE_OTHER): Payer: 59

## 2021-07-30 DIAGNOSIS — R55 Syncope and collapse: Secondary | ICD-10-CM

## 2021-07-31 LAB — CUP PACEART REMOTE DEVICE CHECK
Date Time Interrogation Session: 20230405230616
Implantable Pulse Generator Implant Date: 20210810

## 2021-08-14 NOTE — Progress Notes (Signed)
Carelink Summary Report / Loop Recorder 

## 2021-08-31 LAB — CUP PACEART REMOTE DEVICE CHECK
Date Time Interrogation Session: 20230510230522
Implantable Pulse Generator Implant Date: 20210810

## 2021-09-03 ENCOUNTER — Ambulatory Visit (INDEPENDENT_AMBULATORY_CARE_PROVIDER_SITE_OTHER): Payer: 59

## 2021-09-03 DIAGNOSIS — R55 Syncope and collapse: Secondary | ICD-10-CM | POA: Diagnosis not present

## 2021-09-20 NOTE — Progress Notes (Signed)
Carelink Summary Report / Loop Recorder 

## 2021-10-08 ENCOUNTER — Ambulatory Visit (INDEPENDENT_AMBULATORY_CARE_PROVIDER_SITE_OTHER): Payer: 59

## 2021-10-08 DIAGNOSIS — R55 Syncope and collapse: Secondary | ICD-10-CM

## 2021-10-08 LAB — CUP PACEART REMOTE DEVICE CHECK
Date Time Interrogation Session: 20230612230129
Implantable Pulse Generator Implant Date: 20210810

## 2021-10-25 NOTE — Progress Notes (Signed)
Carelink Summary Report / Loop Recorder 

## 2021-11-11 LAB — CUP PACEART REMOTE DEVICE CHECK
Date Time Interrogation Session: 20230715230756
Implantable Pulse Generator Implant Date: 20210810

## 2021-11-12 ENCOUNTER — Ambulatory Visit (INDEPENDENT_AMBULATORY_CARE_PROVIDER_SITE_OTHER): Payer: 59

## 2021-11-12 DIAGNOSIS — R55 Syncope and collapse: Secondary | ICD-10-CM

## 2021-11-23 ENCOUNTER — Emergency Department (HOSPITAL_COMMUNITY)
Admission: EM | Admit: 2021-11-23 | Discharge: 2021-11-23 | Discharge status: Against Medical Advice | Payer: 59 | Attending: Emergency Medicine | Admitting: Emergency Medicine

## 2021-11-23 ENCOUNTER — Other Ambulatory Visit: Payer: Self-pay

## 2021-11-23 ENCOUNTER — Encounter (HOSPITAL_COMMUNITY): Payer: Self-pay

## 2021-11-23 ENCOUNTER — Emergency Department (HOSPITAL_COMMUNITY): Payer: 59

## 2021-11-23 DIAGNOSIS — R200 Anesthesia of skin: Secondary | ICD-10-CM | POA: Insufficient documentation

## 2021-11-23 DIAGNOSIS — R0602 Shortness of breath: Secondary | ICD-10-CM | POA: Diagnosis not present

## 2021-11-23 DIAGNOSIS — R079 Chest pain, unspecified: Secondary | ICD-10-CM | POA: Insufficient documentation

## 2021-11-23 DIAGNOSIS — R42 Dizziness and giddiness: Secondary | ICD-10-CM | POA: Diagnosis not present

## 2021-11-23 DIAGNOSIS — R202 Paresthesia of skin: Secondary | ICD-10-CM | POA: Diagnosis not present

## 2021-11-23 DIAGNOSIS — Z5321 Procedure and treatment not carried out due to patient leaving prior to being seen by health care provider: Secondary | ICD-10-CM | POA: Diagnosis not present

## 2021-11-23 NOTE — ED Triage Notes (Addendum)
Pt reports trouble breathing when he was driving today and then developed tingling to his entire face and both of his hands that radiated up his arms. He states the tingling has improved to his arms but not his face. He reports feeling lightheaded. Denies any pain. A&Ox4, speaking clear complete sentences. No facial droop noted, strong equal hand grips bilaterally. Pt has heart monitor/loop recorder.

## 2021-11-23 NOTE — ED Provider Triage Note (Signed)
Emergency Medicine Provider Triage Evaluation Note  Lucas Austin , a 31 y.o. male  was evaluated in triage.  Pt complains of chest pain, shortness of breath with associated numbness in bilateral upper extremities and face.  Happened around 1 hour prior to arrival.  Symptoms improving since arriving to the emergency room.   Review of Systems  Positive: As above  Negative: As above  Physical Exam  BP (!) 148/96   Pulse (!) 127   Temp 98.5 F (36.9 C) (Oral)   Resp 20   Ht 5\' 7"  (1.702 m)   Wt 63.5 kg   SpO2 100%   BMI 21.93 kg/m  Gen:   Awake, no distress   Resp:  Normal effort  MSK:   Moves extremities without difficulty Other:  For range of motion in bilateral upper extremities.  Without facial droop, without pronator drift.  Sensation symmetrical in bilateral upper extremities.  Medical Decision Making  Medically screening exam initiated at 7:00 PM.  Appropriate orders placed.  was informed that the remainder of the evaluation will be completed by another provider, this initial triage assessment does not replace that evaluation, and the importance of remaining in the ED until their evaluation is complete.     Clearance [name], PA-C 11/23/21 1901

## 2021-12-12 NOTE — Progress Notes (Signed)
Carelink Summary Report / Loop Recorder 

## 2021-12-14 LAB — CUP PACEART REMOTE DEVICE CHECK
Date Time Interrogation Session: 20230817230440
Implantable Pulse Generator Implant Date: 20210810

## 2021-12-17 ENCOUNTER — Ambulatory Visit (INDEPENDENT_AMBULATORY_CARE_PROVIDER_SITE_OTHER): Payer: 59

## 2021-12-17 DIAGNOSIS — R55 Syncope and collapse: Secondary | ICD-10-CM

## 2021-12-18 ENCOUNTER — Ambulatory Visit (INDEPENDENT_AMBULATORY_CARE_PROVIDER_SITE_OTHER): Payer: 59

## 2021-12-18 ENCOUNTER — Encounter: Payer: Self-pay | Admitting: Nurse Practitioner

## 2021-12-18 ENCOUNTER — Ambulatory Visit (INDEPENDENT_AMBULATORY_CARE_PROVIDER_SITE_OTHER): Payer: 59 | Admitting: Nurse Practitioner

## 2021-12-18 VITALS — BP 98/80 | HR 95 | Temp 96.8°F | Ht 66.75 in | Wt 143.4 lb

## 2021-12-18 DIAGNOSIS — F419 Anxiety disorder, unspecified: Secondary | ICD-10-CM | POA: Diagnosis not present

## 2021-12-18 DIAGNOSIS — R0602 Shortness of breath: Secondary | ICD-10-CM

## 2021-12-18 DIAGNOSIS — R002 Palpitations: Secondary | ICD-10-CM | POA: Diagnosis not present

## 2021-12-18 LAB — COMPREHENSIVE METABOLIC PANEL
ALT: 29 U/L (ref 0–53)
AST: 17 U/L (ref 0–37)
Albumin: 4.6 g/dL (ref 3.5–5.2)
Alkaline Phosphatase: 70 U/L (ref 39–117)
BUN: 15 mg/dL (ref 6–23)
CO2: 22 mEq/L (ref 19–32)
Calcium: 10.4 mg/dL (ref 8.4–10.5)
Chloride: 105 mEq/L (ref 96–112)
Creatinine, Ser: 1 mg/dL (ref 0.40–1.50)
GFR: 99.48 mL/min (ref >60.00)
Glucose, Bld: 86 mg/dL (ref 70–99)
Potassium: 3.9 mEq/L (ref 3.5–5.1)
Sodium: 139 mEq/L (ref 135–145)
Total Bilirubin: 1 mg/dL (ref 0.2–1.2)
Total Protein: 7.8 g/dL (ref 6.0–8.3)

## 2021-12-18 LAB — CBC WITH DIFFERENTIAL/PLATELET
Basophils Absolute: 0.1 10*3/uL (ref 0.0–0.1)
Basophils Relative: 2 % (ref 0.0–3.0)
Eosinophils Absolute: 0.1 10*3/uL (ref 0.0–0.7)
Eosinophils Relative: 2.5 % (ref 0.0–5.0)
HCT: 44 % (ref 39.0–52.0)
Hemoglobin: 15 g/dL (ref 13.0–17.0)
Lymphocytes Relative: 27.7 % (ref 12.0–46.0)
Lymphs Abs: 1.4 10*3/uL (ref 0.7–4.0)
MCHC: 34 g/dL (ref 30.0–36.0)
MCV: 85.1 fl (ref 78.0–100.0)
Monocytes Absolute: 0.4 10*3/uL (ref 0.1–1.0)
Monocytes Relative: 8.2 % (ref 3.0–12.0)
Neutro Abs: 2.9 10*3/uL (ref 1.4–7.7)
Neutrophils Relative %: 59.6 % (ref 43.0–77.0)
Platelets: 268 10*3/uL (ref 150.0–400.0)
RBC: 5.18 Mil/uL (ref 4.22–5.81)
RDW: 14.1 % (ref 11.5–15.5)
WBC: 4.9 10*3/uL (ref 4.0–10.5)

## 2021-12-18 LAB — TSH: TSH: 2.07 u[IU]/mL (ref 0.35–5.50)

## 2021-12-18 MED ORDER — HYDROXYZINE HCL 10 MG PO TABS: 10.0000 mg | ORAL_TABLET | Freq: Three times a day (TID) | ORAL | 0 refills | Status: DC | PRN

## 2021-12-18 NOTE — Progress Notes (Signed)
New Patient Visit  BP 98/80   Pulse 95   Temp (!) 96.8 F (36 C) (Temporal)   Ht 5' 6.75" (1.695 m)   Wt 143 lb 6.4 oz (65 kg)   SpO2 99%   BMI 22.63 kg/m    Subjective:    Patient ID: Lucas Austin, male    DOB: 27-Oct-1990, 31 y.o.   MRN: [id number]  CC: Chief Complaint  Patient presents with   Establish Care    Np. Est care. Pt c/o mild SOB w/ cough x4 days. At-home covid test was neg on 12/17/21     HPI: Lucas Austin is a 31 y.o. male presents for new patient visit to establish care.  Introduced to Publishing rights manager role and practice setting.  All questions answered.  Discussed provider/patient relationship and expectations.  He has a history of heart palpitations after getting a covid-19 vaccine 2 years ago. He is following with cardiology and has a loop recorder implanted.   He has been experiencing shortness of breath since Friday. He has been having more panic attacks recently. He will typically have shortness of breath during these panic attacks that lasts a few hours, then goes away. He has been experiencing increased anxiety since his heart palpitations started. He has an appointment next week with a psychiatrist. He has panic attacks almost every time he leaves the house, especially when in the car.   SHORTNESS OF BREATH  Duration: 3-4 days Onset: sudden Description of breathing discomfort: every few often, he feels like he needs to take a deep breath Episode duration: continuous Frequency: continuous  Related to exertion: no Cough: no Chest tightness: no Wheezing: no Fevers: no Chest pain: no Palpitations: yes  Nausea: no Diaphoresis: no Deconditioning: no Status: stable Aggravating factors: thinking about his breathing too much Alleviating factors: Laying down and deep breathing Treatments attempted: deep breathing, hot shower     12/18/2021   10:58 AM  Depression screen PHQ 2/9  Decreased Interest 0  Down, Depressed, Hopeless  1  PHQ - 2 Score 1  Altered sleeping 1  Tired, decreased energy 0  Change in appetite 2  Feeling bad or failure about yourself  0  Trouble concentrating 0  Moving slowly or fidgety/restless 1  Suicidal thoughts 0  PHQ-9 Score 5  Difficult doing work/chores Not difficult at all      12/18/2021   10:58 AM  GAD 7 : Generalized Anxiety Score  Nervous, Anxious, on Edge 3  Control/stop worrying 2  Worry too much - different things 2  Trouble relaxing 2  Restless 1  Easily annoyed or irritable 1  Afraid - awful might happen 2  Total GAD 7 Score 13  Anxiety Difficulty Very difficult    Past Medical History:  Diagnosis Date   Palpitation    PVC's (premature ventricular contractions)     Past Surgical History:  Procedure Laterality Date   implantable loop recorder placement  11/30/2019   Medtronic Reveal Linq model [id number] implantable loop recorder (SN [id number]) by Dr Johney Frame for syncope evaluation    Family History  Problem Relation Age of Onset   Pulmonary fibrosis Mother    Diabetes Maternal Grandmother    Aneurysm Maternal Grandfather    Aneurysm Paternal Grandfather      Social History   Tobacco Use   Smoking status: Never   Smokeless tobacco: Never  Vaping Use   Vaping Use: Never used  Substance Use Topics   Alcohol use: Never  Drug use: Never    No current outpatient medications on file prior to visit.   No current facility-administered medications on file prior to visit.     Review of Systems  Constitutional:  Positive for fatigue. Negative for fever.  HENT:  Positive for congestion and ear pain (ear popping). Negative for postnasal drip, sneezing and sore throat.   Eyes: Negative.   Respiratory:  Positive for shortness of breath. Negative for cough and chest tightness.   Cardiovascular:  Positive for palpitations. Negative for chest pain.  Gastrointestinal: Negative.   Genitourinary: Negative.   Musculoskeletal: Negative.   Skin: Negative.    Neurological:  Positive for headaches (intermittent). Negative for dizziness.  Psychiatric/Behavioral:  The patient is nervous/anxious.       Objective:    BP 98/80   Pulse 95   Temp (!) 96.8 F (36 C) (Temporal)   Ht 5' 6.75" (1.695 m)   Wt 143 lb 6.4 oz (65 kg)   SpO2 99%   BMI 22.63 kg/m   Wt Readings from Last 3 Encounters:  12/18/21 143 lb 6.4 oz (65 kg)  11/23/21 140 lb (63.5 kg)  04/06/21 157 lb (71.2 kg)    BP Readings from Last 3 Encounters:  12/18/21 98/80  11/23/21 (!) 148/96  04/06/21 124/72    Physical Exam Vitals and nursing note reviewed.  Constitutional:      Appearance: Normal appearance.  HENT:     Head: Normocephalic.     Right Ear: Tympanic membrane, ear canal and external ear normal.     Left Ear: Tympanic membrane, ear canal and external ear normal.  Eyes:     Conjunctiva/sclera: Conjunctivae normal.  Cardiovascular:     Rate and Rhythm: Normal rate and regular rhythm.     Pulses: Normal pulses.     Heart sounds: Normal heart sounds.  Pulmonary:     Effort: Pulmonary effort is normal.     Breath sounds: Normal breath sounds.  Abdominal:     Palpations: Abdomen is soft.     Tenderness: There is no abdominal tenderness.  Musculoskeletal:        General: Normal range of motion.     Cervical back: Normal range of motion and neck supple. No tenderness.     Right lower leg: No edema.     Left lower leg: No edema.  Lymphadenopathy:     Cervical: No cervical adenopathy.  Skin:    General: Skin is warm.  Neurological:     General: No focal deficit present.     Mental Status: He is alert and oriented to person, place, and time.  Psychiatric:        Mood and Affect: Mood normal.        Behavior: Behavior normal.        Thought Content: Thought content normal.        Judgment: Judgment normal.        Assessment & Plan:   Problem List Items Addressed This Visit       Other   Shortness of breath - Primary    He has been experiencing  shortness of breath for the past 3-4 days. He thinks it may be his anxiety, however wants to make sure it isn't anything physically wrong. Lungs are clear on exam. Will check CMP, CBC, iron panel, and TSH. Will also get a chest x-ray. Follow-up in 2-3 weeks.       Relevant Orders   CBC with Differential/Platelet   Comprehensive  metabolic panel   Iron, TIBC and Ferritin Panel   TSH   DG Chest 2 View   Palpitation    He has been having intermittent palpitations over the last few years since getting the coivd-19 vaccination. He has a loop recorder implanted and is following with cardiology. Continue collaboration and recommendations from cardiology.       Relevant Orders   TSH   Anxiety    He has been experiencing anxiety since having heart palpitations after getting the covid-19 vaccine 2 years ago. His anxiety has worsened in the past few weeks and is starting to interfere with his every day life. He has an appointment with a psychiatrist next week. His PHQ 9 is a 5 and his GAD 7 is a 13. Discussed non-pharmacological treatments like exercise, getting enough sleep, and deep breathing. Will start him on hydroxyzine 10mg  as needed for panic attack. Discussed that he will most likely need a daily medication as well, however will defer to psychiatry. Follow-up in 2-3 weeks.       Relevant Medications   hydrOXYzine (ATARAX) 10 MG tablet     Follow up plan: Return in about 3 weeks (around 01/08/2022) for CPE.

## 2021-12-18 NOTE — Progress Notes (Unsigned)
Initial visit for SOB. No cough or other symptoms reported

## 2021-12-18 NOTE — Assessment & Plan Note (Signed)
He has been having intermittent palpitations over the last few years since getting the coivd-19 vaccination. He has a loop recorder implanted and is following with cardiology. Continue collaboration and recommendations from cardiology.

## 2021-12-18 NOTE — Assessment & Plan Note (Signed)
He has been experiencing shortness of breath for the past 3-4 days. He thinks it may be his anxiety, however wants to make sure it isn't anything physically wrong. Lungs are clear on exam. Will check CMP, CBC, iron panel, and TSH. Will also get a chest x-ray. Follow-up in 2-3 weeks.

## 2021-12-18 NOTE — Assessment & Plan Note (Signed)
He has been experiencing anxiety since having heart palpitations after getting the covid-19 vaccine 2 years ago. His anxiety has worsened in the past few weeks and is starting to interfere with his every day life. He has an appointment with a psychiatrist next week. His PHQ 9 is a 5 and his GAD 7 is a 13. Discussed non-pharmacological treatments like exercise, getting enough sleep, and deep breathing. Will start him on hydroxyzine 10mg  as needed for panic attack. Discussed that he will most likely need a daily medication as well, however will defer to psychiatry. Follow-up in 2-3 weeks.

## 2021-12-18 NOTE — Patient Instructions (Signed)
It was great to see you!  Start hydroxyzine 3 times a day as needed for anxiety.  We are checking your labs today and will let you know the results via mychart/phone.   You can start claritin (Loratadine) or flonase nasal spray to help with your allergies.   Let's follow-up in 2-3 weeks, sooner if you have concerns.  If a referral was placed today, you will be contacted for an appointment. Please note that routine referrals can sometimes take up to 3-4 weeks to process. Please call our office if you haven't heard anything after this time frame.  Take care,  Rodman Pickle, NP

## 2021-12-19 LAB — IRON,TIBC AND FERRITIN PANEL
%SAT: 52 % (calc) — ABNORMAL HIGH (ref 20–48)
Ferritin: 93 ng/mL (ref 38–380)
Iron: 167 ug/dL (ref 50–180)
TIBC: 323 mcg/dL (calc) (ref 250–425)

## 2021-12-23 ENCOUNTER — Encounter (HOSPITAL_COMMUNITY): Payer: Self-pay

## 2021-12-23 ENCOUNTER — Ambulatory Visit (HOSPITAL_COMMUNITY)
Admission: EM | Admit: 2021-12-23 | Discharge: 2021-12-23 | Disposition: A | Discharge status: Home / Self Care | Payer: 59

## 2021-12-23 DIAGNOSIS — R0602 Shortness of breath: Secondary | ICD-10-CM

## 2021-12-23 IMAGING — CR DG CHEST 2V
2 series · 2 of 2 positions shown · non-contrast
Comparison: None.

CLINICAL DATA: Chest palpitations since last night.  Chest burning.

EXAM:
CHEST - 2 VIEW

[w chest pa]
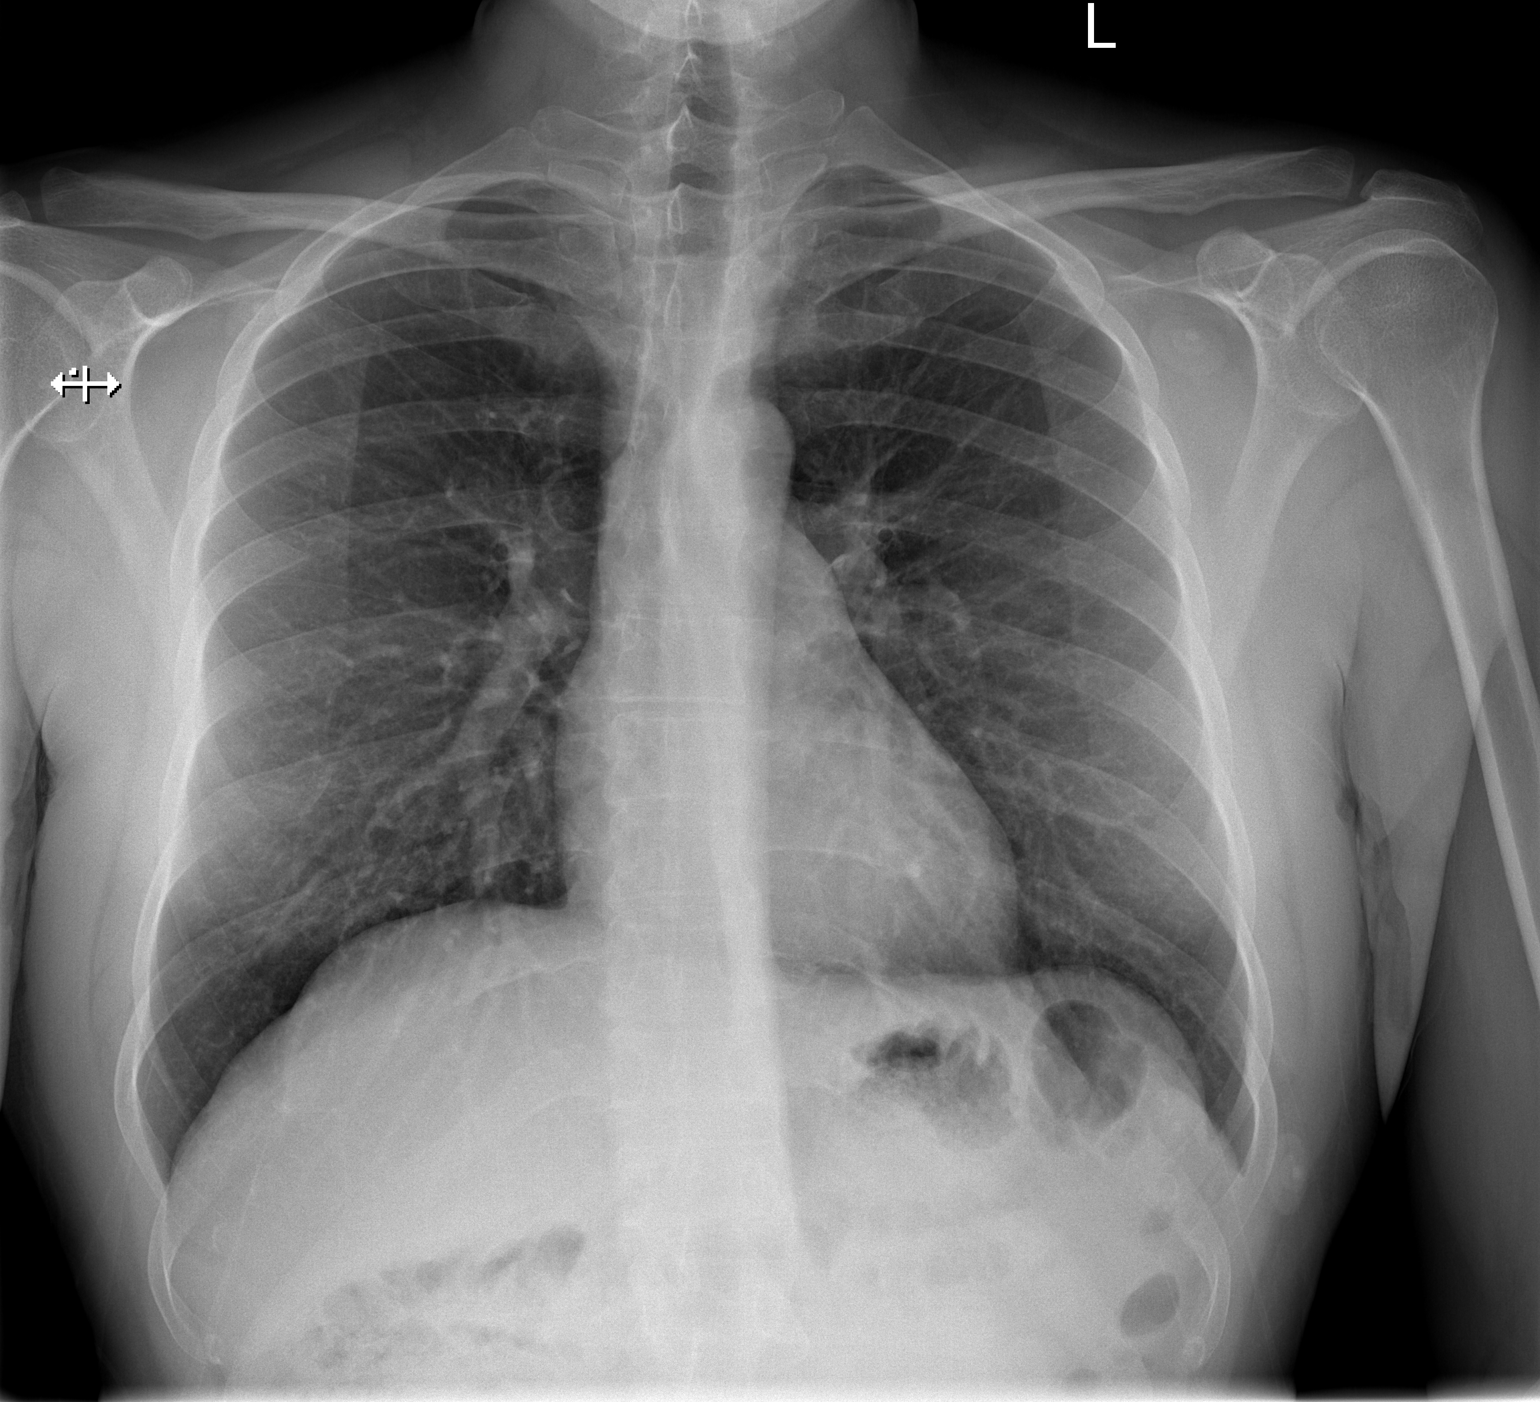

[w chest lat]
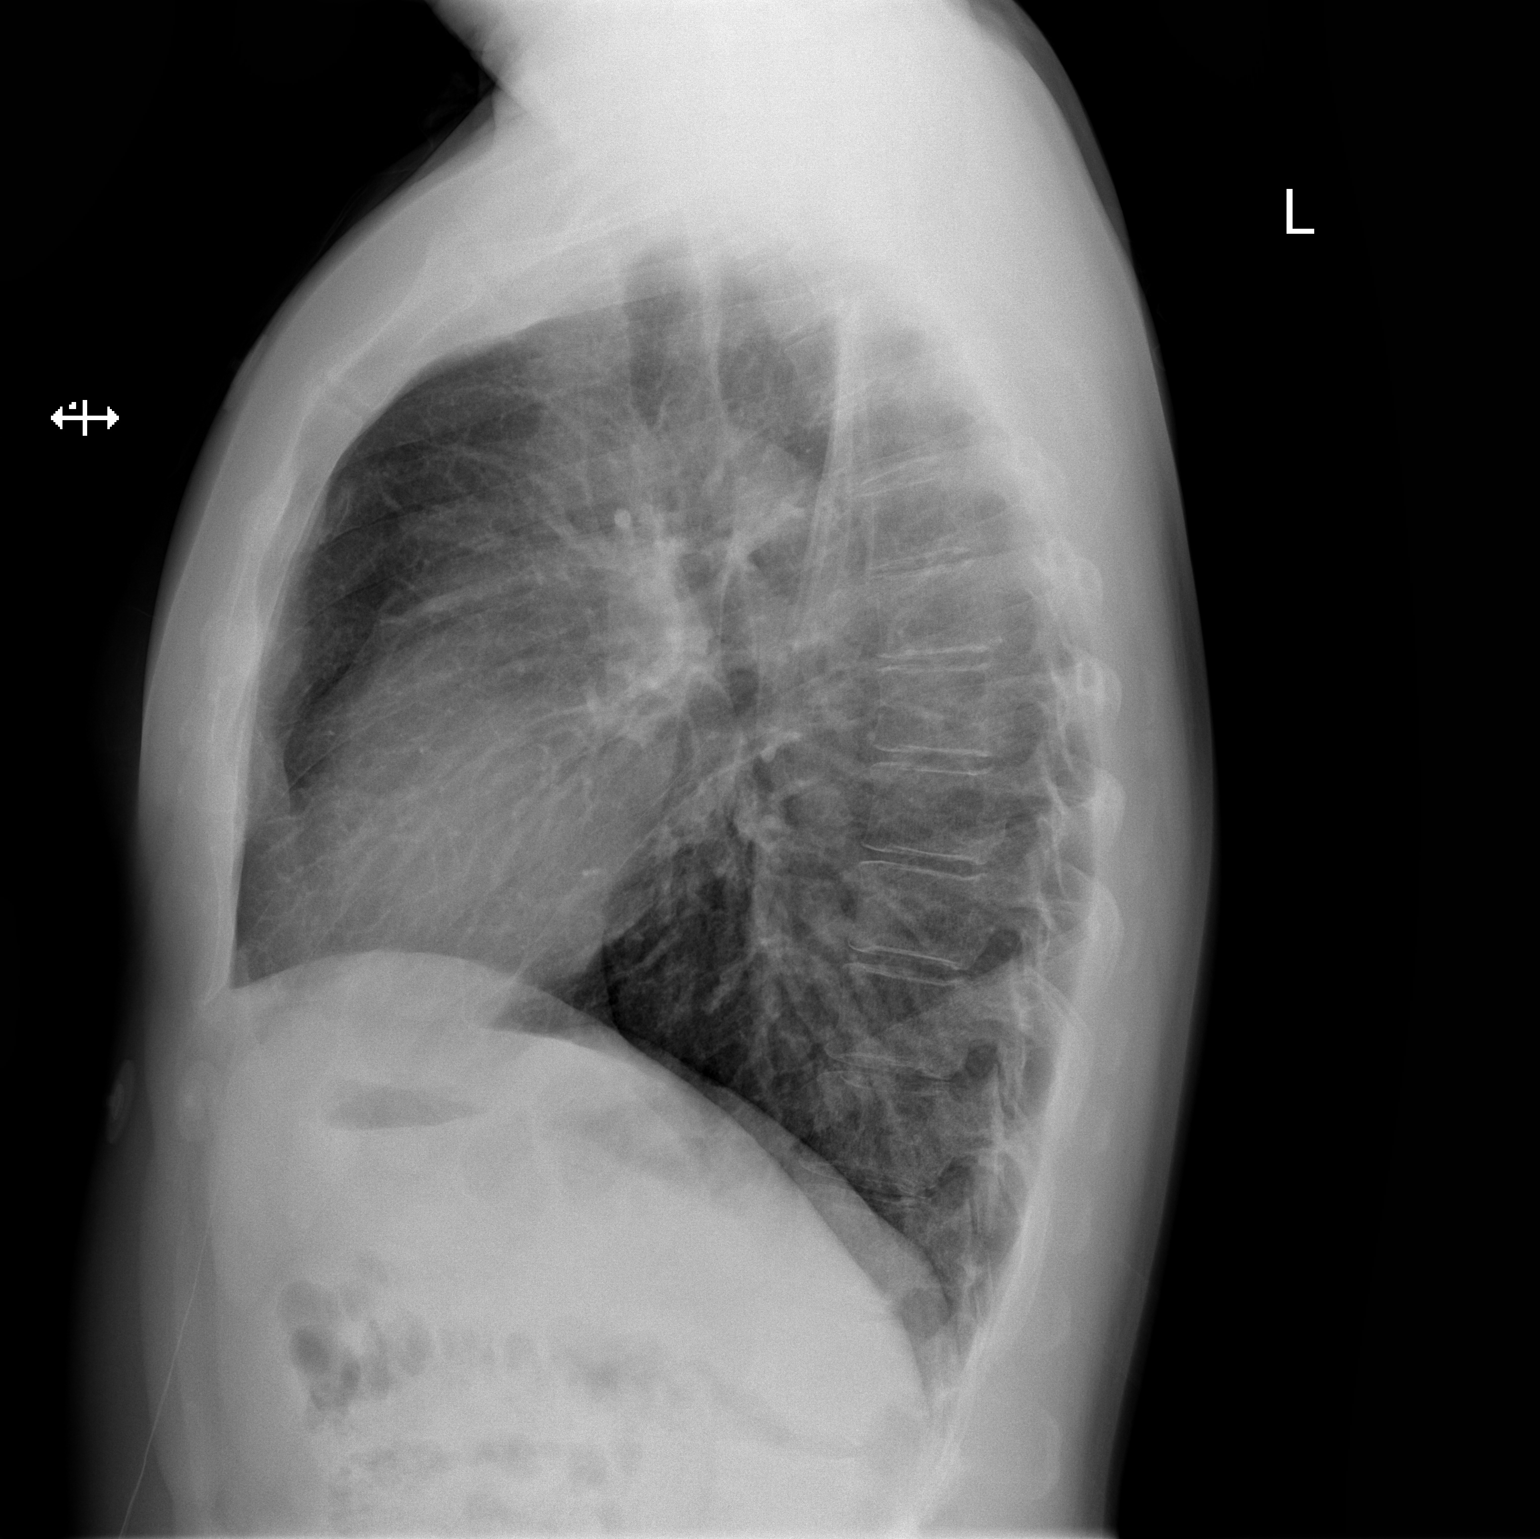

[2 of 2 positions shown; findings below may reference images not displayed]

FINDINGS: Normal heart, mediastinum and hila.

Clear lungs.  No pleural effusion or pneumothorax.

Skeletal structures are within normal limits.
IMPRESSION: Normal chest radiographs.

## 2021-12-23 NOTE — ED Provider Notes (Signed)
[facility]    CSN: [id number] Arrival date & time: 12/23/21  1556      History   Chief Complaint Chief Complaint  Patient presents with   Shortness of Breath    HPI Lucas Austin is a 31 y.o. male.   Sob with rest, intermittent nasal congestion,   Robitussin, known sick contacts, mild seasonal allergies,   Evaluated   Saline nasal spray,   Past Medical History:  Diagnosis Date   Palpitation    PVC's (premature ventricular contractions)     Patient Active Problem List   Diagnosis Date Noted   Shortness of breath 12/18/2021   Palpitation 12/18/2021   Anxiety 12/18/2021    Past Surgical History:  Procedure Laterality Date   implantable loop recorder placement  11/30/2019   Medtronic Reveal Linq model [id number] implantable loop recorder (SN [id number]) by Dr Johney Frame for syncope evaluation       Home Medications    Prior to Admission medications   Medication Sig Start Date End Date Taking? Authorizing Provider  albuterol (VENTOLIN HFA) 108 (90 Base) MCG/ACT inhaler SMARTSIG:1-2 Puff(s) Via Inhaler Every 6-8 Hours PRN 12/20/21   [provider]  hydrOXYzine (ATARAX) 10 MG tablet Take 1 tablet (10 mg total) by mouth 3 (three) times daily as needed. 12/18/21   McElwee, Lauren A, NP  montelukast (SINGULAIR) 10 MG tablet Take 10 mg by mouth daily. 12/20/21   [provider]  predniSONE (DELTASONE) 20 MG tablet Take 40 mg by mouth daily. 12/19/21   [provider]    Family History Family History  Problem Relation Age of Onset   Pulmonary fibrosis Mother    Diabetes Maternal Grandmother    Aneurysm Maternal Grandfather    Aneurysm Paternal Grandfather     Social History Social History   Tobacco Use   Smoking status: Never   Smokeless tobacco: Never  Vaping Use   Vaping Use: Never used  Substance Use Topics   Alcohol use: Never   Drug use: Never     Allergies   Patient has no known allergies.   Review of  Systems Review of Systems  Constitutional: Negative.   HENT:  Positive for congestion and rhinorrhea. Negative for dental problem, drooling, ear discharge, ear pain, facial swelling, hearing loss, mouth sores, nosebleeds, postnasal drip, sinus pressure, sinus pain, sneezing, sore throat, tinnitus, trouble swallowing and voice change.   Respiratory:  Positive for shortness of breath. Negative for apnea, cough, choking, chest tightness, wheezing and stridor.   Cardiovascular: Negative.   Gastrointestinal: Negative.   Skin: Negative.   Neurological: Negative.      Physical Exam Triage Vital Signs ED Triage Vitals  Enc Vitals Group     BP 12/23/21 1710 131/83     Pulse Rate 12/23/21 1710 83     Resp 12/23/21 1710 12     Temp 12/23/21 1710 99 F (37.2 C)     Temp Source 12/23/21 1710 Oral     SpO2 12/23/21 1710 98 %     Weight 12/23/21 1706 140 lb (63.5 kg)     Height 12/23/21 1706 5\' 7"  (1.702 m)     Head Circumference --      Peak Flow --      Pain Score 12/23/21 1705 2     Pain Loc --      Pain Edu? --      Excl. in GC? --    No data found.  Updated Vital Signs BP 131/83 (  BP Location: Left Arm)   Pulse 83   Temp 99 F (37.2 C) (Oral)   Resp 12   Ht 5\' 7"  (1.702 m)   Wt 140 lb (63.5 kg)   SpO2 98%   BMI 21.93 kg/m   Visual Acuity Right Eye Distance:   Left Eye Distance:   Bilateral Distance:    Right Eye Near:   Left Eye Near:    Bilateral Near:     Physical Exam   UC Treatments / Results  Labs (all labs ordered are listed, but only abnormal results are displayed) Labs Reviewed - No data to display  EKG   Radiology No results found.  Procedures Procedures (including critical care time)  Medications Ordered in UC Medications - No data to display  Initial Impression / Assessment and Plan / UC Course  I have reviewed the triage vital signs and the nursing notes.  Pertinent labs & imaging results that were available during my care of the patient  were reviewed by me and considered in my medical decision making (see chart for details).     *** Final Clinical Impressions(s) / UC Diagnoses   Final diagnoses:  None   Discharge Instructions   None    ED Prescriptions   None    PDMP not reviewed this encounter.

## 2021-12-23 NOTE — ED Triage Notes (Signed)
Pt has a cough , nasal congestion and chest congestion S.O.B on and off  x2wks. Pt denies fever, pt did a virtual from a urgent care and was prescribed med's . As per pt states he did not take any of the medications prescribed. Pt took a covid test at home it was negative for covid and flu as per pt.

## 2021-12-23 NOTE — Discharge Instructions (Signed)
On exam your lungs are clear and your oxygen saturation is 98% without any assistance, a normal level is above 90 without assistance, your recent chest x-ray showed no complications to the lungs and therefore I do not believe this is the cause of your shortness of breath  Due to your history of palpitations we have obtained a EKG to look at your heart rhythm, it is beating in a normal pace and rhythm today and I do not believe your heart is causing your current symptoms  Please keep your follow-up appointment with your primary care doctor for reevaluation however at any point if you feel like your shortness of breath is worsening please go to the nearest emergency department  As you already have an albuterol inhaler, please attempt use to see if this helps to minimize your symptoms, it is normal to feel jittery after, this typically resolves within a few minutes  Help with your shortness of breath you may need to take more frequent rest breaks when completing activity alternating activity in 10 to 15-minute intervals to incorporate breaks  Your hydroxyzine that was prescribed is to help manage her anxiety, it was recommended by your primary doctor that you begin use of Claritin which is used to help minimize congestion which may provide you relief within the surrounding  May continue use of a saline nasal spray if helpful, it was recommended by your primary doctor that you attempt use of Flonase nasal spray which is a steroid which will help minimize some of your congestions and also help clear it from the sinus cavity

## 2021-12-27 ENCOUNTER — Ambulatory Visit: Payer: 59 | Admitting: Family Medicine

## 2021-12-31 NOTE — Progress Notes (Unsigned)
Electrophysiology Office Note Date: 12/31/2021  ID:  Lucas, Austin Jun 25, 1990, MRN [id number]  PCP: Patient, No Pcp Per Primary Cardiologist: Lucas Magic, MD Electrophysiologist: Lucas Range, MD -> {HYQMV:78469}   CC: ILR follow-up  Lucas Austin is a 31 y.o. male seen today for {EPMDS:28135} . he presents today for routine electrophysiology followup.  last being seen in our clinic the patient reports doing ***.  he denies chest pain, palpitations, dyspnea, PND, orthopnea, nausea, vomiting, dizziness, syncope, edema, weight gain, or early satiety. .  Device History: Medtronic loop recorder implanted 11/2019 for palpitations and recurrent unexplained syncope.   Past Medical History:  Diagnosis Date   Palpitation    PVC's (premature ventricular contractions)    Past Surgical History:  Procedure Laterality Date   implantable loop recorder placement  11/30/2019   Medtronic Reveal Linq model [id number] implantable loop recorder (SN [id number]) by Dr Lucas Austin for syncope evaluation    Current Outpatient Medications  Medication Sig Dispense Refill   albuterol (VENTOLIN HFA) 108 (90 Base) MCG/ACT inhaler SMARTSIG:1-2 Puff(s) Via Inhaler Every 6-8 Hours PRN     hydrOXYzine (ATARAX) 10 MG tablet Take 1 tablet (10 mg total) by mouth 3 (three) times daily as needed. 30 tablet 0   montelukast (SINGULAIR) 10 MG tablet Take 10 mg by mouth daily.     predniSONE (DELTASONE) 20 MG tablet Take 40 mg by mouth daily.     No current facility-administered medications for this visit.    Allergies:   Patient has no known allergies.   Social History: Social History   Socioeconomic History   Marital status: Married    Spouse name: Not on file   Number of children: Not on file   Years of education: Not on file   Highest education level: Not on file  Occupational History   Not on file  Tobacco Use   Smoking status: Never   Smokeless tobacco: Never  Vaping Use   Vaping  Use: Never used  Substance and Sexual Activity   Alcohol use: Never   Drug use: Never   Sexual activity: Yes    Comment: wife has nexplanon  Other Topics Concern   Not on file  Social History Narrative   Lives with wife in [place]   Works in Systems developer   Social Determinants of Health   Financial Resource Strain: Not on file  Food Insecurity: Not on file  Transportation Needs: Not on file  Physical Activity: Not on file  Stress: Not on file  Social Connections: Not on file  Intimate Partner Violence: Not on file    Family History: Family History  Problem Relation Age of Onset   Pulmonary fibrosis Mother    Diabetes Maternal Grandmother    Aneurysm Maternal Grandfather    Aneurysm Paternal Grandfather      Review of Systems: All other systems reviewed and are otherwise negative except as noted above.  Physical Exam: There were no vitals filed for this visit.   GEN- The patient is well appearing, alert and oriented x 3 today.   HEENT: normocephalic, atraumatic; sclera clear, conjunctiva pink; hearing intact; oropharynx clear; neck supple  Lungs- Clear to ausculation bilaterally, normal work of breathing.  No wheezes, rales, rhonchi Heart- Regular rate and rhythm, no murmurs, rubs or gallops  GI- soft, non-tender, non-distended, bowel sounds present  Extremities- no clubbing, cyanosis, or edema  MS- no significant deformity or atrophy Skin- warm and dry, no rash or lesion; ILR pocket well  healed Psych- euthymic mood, full affect Neuro- strength and sensation are intact  PPM Interrogation- reviewed in detail today,  See PACEART report  EKG:  EKG is not ordered today. The ekg ordered 12/23/2021 shows NSR at 73 bpm  Recent Labs: 12/18/2021: ALT 29; BUN 15; Creatinine, Ser 1.00; Hemoglobin 15.0; Platelets 268.0; Potassium 3.9; Sodium 139; TSH 2.07   Wt Readings from Last 3 Encounters:  12/23/21 140 lb (63.5 kg)  12/18/21 143 lb 6.4 oz (65 kg)  11/23/21 140 lb  (63.5 kg)     Other studies Reviewed: Additional studies/ records that were reviewed today include: Previous EP office notes, Previous remote checks, Most recent labwork.   Assessment and Plan:  1. Syncope s/p Medtronic Loop recorder 2. Palpitations Normal device function See Pace Art report No changes today  Current medicines are reviewed at length with the patient today.   The patient {ACTIONS; HAS/DOES NOT HAVE:19233} concerns regarding his medicines.  The following changes were made today:  {NONE DEFAULTED:18576}  Labs/ tests ordered today include: *** No orders of the defined types were placed in this encounter.   Disposition:   Follow up with {EPMDS:28135}  in *** {Blank single:19197::"Months","Weeks"}    Signed, Lucas Carrier, PA-C  12/31/2021 11:23 AM  [facility] [address] [place] [address] [phone] (office) [phone] (fax)

## 2022-01-01 ENCOUNTER — Encounter: Payer: Self-pay | Admitting: Student

## 2022-01-01 ENCOUNTER — Ambulatory Visit: Payer: 59 | Attending: Student | Admitting: Student

## 2022-01-01 VITALS — BP 120/80 | HR 90 | Ht 67.0 in | Wt 143.6 lb

## 2022-01-01 DIAGNOSIS — R002 Palpitations: Secondary | ICD-10-CM

## 2022-01-01 DIAGNOSIS — R0609 Other forms of dyspnea: Secondary | ICD-10-CM | POA: Diagnosis not present

## 2022-01-01 DIAGNOSIS — R55 Syncope and collapse: Secondary | ICD-10-CM

## 2022-01-01 NOTE — Patient Instructions (Signed)
Medication Instructions:  Your physician recommends that you continue on your current medications as directed. Please refer to the Current Medication list given to you today.  *If you need a refill on your cardiac medications before your next appointment, please call your pharmacy*   Lab Work: None If you have labs (blood work) drawn today and your tests are completely normal, you will receive your results only by: MyChart Message (if you have MyChart) OR A paper copy in the mail If you have any lab test that is abnormal or we need to change your treatment, we will call you to review the results.   Testing/Procedures: Your physician has recommended that you have a cardiopulmonary stress test (CPX). CPX testing is a non-invasive measurement of heart and lung function. It replaces a traditional treadmill stress test. This type of test provides a tremendous amount of information that relates not only to your present condition but also for future outcomes. This test combines measurements of you ventilation, respiratory gas exchange in the lungs, electrocardiogram (EKG), blood pressure and physical response before, during, and following an exercise protocol.   Follow-Up: At [facility], you and your health needs are our priority.  As part of our continuing mission to provide you with exceptional heart care, we have created designated Provider Care Teams.  These Care Teams include your primary Cardiologist (physician) and Advanced Practice Providers (APPs -  Physician Assistants and Nurse Practitioners) who all work together to provide you with the care you need, when you need it.   Your next appointment:   6 month(s)  The format for your next appointment:   In Person  Provider:   Loman Brooklyn, MD

## 2022-01-07 ENCOUNTER — Ambulatory Visit (HOSPITAL_COMMUNITY): Payer: 59 | Attending: Internal Medicine

## 2022-01-07 DIAGNOSIS — R0600 Dyspnea, unspecified: Secondary | ICD-10-CM

## 2022-01-07 DIAGNOSIS — R0609 Other forms of dyspnea: Secondary | ICD-10-CM

## 2022-01-08 NOTE — Progress Notes (Unsigned)
There were no vitals taken for this visit.   Subjective:    Patient ID: Lucas Austin, male    DOB: [birth year], 31 y.o.   MRN: [id number]  CC: No chief complaint on file.  HPI: Lucas Austin is a 31 y.o. male presenting on 01/09/2022 for comprehensive medical examination. Current medical complaints include:{Blank single:19197::"none","***"}  He currently lives with: Interim Problems from his last visit: {Blank single:19197::"yes","no"}  Depression Screen done today and results listed below:     12/18/2021   10:58 AM  Depression screen PHQ 2/9  Decreased Interest 0  Down, Depressed, Hopeless 1  PHQ - 2 Score 1  Altered sleeping 1  Tired, decreased energy 0  Change in appetite 2  Feeling bad or failure about yourself  0  Trouble concentrating 0  Moving slowly or fidgety/restless 1  Suicidal thoughts 0  PHQ-9 Score 5  Difficult doing work/chores Not difficult at all    The patient {has/does not have:19849} a history of falls. I {did/did not:19850} complete a risk assessment for falls. A plan of care for falls {was/was not:19852} documented.   Past Medical History:  Past Medical History:  Diagnosis Date   Palpitation    PVC's (premature ventricular contractions)     Surgical History:  Past Surgical History:  Procedure Laterality Date   implantable loop recorder placement  11/30/2019   Medtronic Reveal Linq model [id number] implantable loop recorder (SN [id number]) by Dr Rayann Heman for syncope evaluation    Medications:  Current Outpatient Medications on File Prior to Visit  Medication Sig   busPIRone (BUSPAR) 10 MG tablet Take 10 mg by mouth 2 (two) times daily. (Patient not taking: Reported on 01/01/2022)   escitalopram (LEXAPRO) 10 MG tablet Take 10 mg by mouth daily. (Patient not taking: Reported on 01/01/2022)   hydrOXYzine (ATARAX) 10 MG tablet Take 1 tablet (10 mg total) by mouth 3 (three) times daily as needed.   No current facility-administered  medications on file prior to visit.    Allergies:  No Known Allergies  Social History:   Social History   Tobacco Use  Smoking Status Never  Smokeless Tobacco Never   Social History   Substance and Sexual Activity  Alcohol Use Never    Family History:  Family History  Problem Relation Age of Onset   Pulmonary fibrosis Mother    Diabetes Maternal Grandmother    Aneurysm Maternal Grandfather    Aneurysm Paternal Grandfather     Past medical history, surgical history, medications, allergies, family history and social history reviewed with patient today and changes made to appropriate areas of the chart.   ROS All other ROS negative except what is listed above and in the HPI.      Objective:    There were no vitals taken for this visit.  Wt Readings from Last 3 Encounters:  01/01/22 143 lb 9.6 oz (65.1 kg)  12/23/21 140 lb (63.5 kg)  12/18/21 143 lb 6.4 oz (65 kg)    Physical Exam  Results for orders placed or performed in visit on 12/18/21  CBC with Differential/Platelet  Result Value Ref Range   WBC 4.9 4.0 - 10.5 K/uL   RBC 5.18 4.22 - 5.81 Mil/uL   Hemoglobin 15.0 13.0 - 17.0 g/dL   HCT 44.0 39.0 - 52.0 %   MCV 85.1 78.0 - 100.0 fl   MCHC 34.0 30.0 - 36.0 g/dL   RDW 14.1 11.5 - 15.5 %   Platelets 268.0 150.0 - 400.0  K/uL   Neutrophils Relative % 59.6 43.0 - 77.0 %   Lymphocytes Relative 27.7 12.0 - 46.0 %   Monocytes Relative 8.2 3.0 - 12.0 %   Eosinophils Relative 2.5 0.0 - 5.0 %   Basophils Relative 2.0 0.0 - 3.0 %   Neutro Abs 2.9 1.4 - 7.7 K/uL   Lymphs Abs 1.4 0.7 - 4.0 K/uL   Monocytes Absolute 0.4 0.1 - 1.0 K/uL   Eosinophils Absolute 0.1 0.0 - 0.7 K/uL   Basophils Absolute 0.1 0.0 - 0.1 K/uL  Comprehensive metabolic panel  Result Value Ref Range   Sodium 139 135 - 145 mEq/L   Potassium 3.9 3.5 - 5.1 mEq/L   Chloride 105 96 - 112 mEq/L   CO2 22 19 - 32 mEq/L   Glucose, Bld 86 70 - 99 mg/dL   BUN 15 6 - 23 mg/dL   Creatinine, Ser 1.00  0.40 - 1.50 mg/dL   Total Bilirubin 1.0 0.2 - 1.2 mg/dL   Alkaline Phosphatase 70 39 - 117 U/L   AST 17 0 - 37 U/L   ALT 29 0 - 53 U/L   Total Protein 7.8 6.0 - 8.3 g/dL   Albumin 4.6 3.5 - 5.2 g/dL   GFR 99.48 >60.00 mL/min   Calcium 10.4 8.4 - 10.5 mg/dL  Iron, TIBC and Ferritin Panel  Result Value Ref Range   Iron 167 50 - 180 mcg/dL   TIBC 323 250 - 425 mcg/dL (calc)   %SAT 52 (H) 20 - 48 % (calc)   Ferritin 93 38 - 380 ng/mL  TSH  Result Value Ref Range   TSH 2.07 0.35 - 5.50 uIU/mL      Assessment & Plan:   Problem List Items Addressed This Visit   None    IMMUNIZATIONS:   - Tdap: Tetanus vaccination status reviewed: {tetanus status:315746}. - Influenza: {Blank single:19197::"Up to date","Administered today","Postponed to flu season","Refused","Given elsewhere"} - Pneumovax: Not applicable - Prevnar: Not applicable - HPV: Not applicable - Zostavax vaccine: Not applicable  SCREENING: - Colonoscopy: Not applicable  Discussed with patient purpose of the colonoscopy is to detect colon cancer at curable precancerous or early stages   - AAA Screening: Not applicable  -Hearing Test: Not applicable  -Spirometry: Not applicable   PATIENT COUNSELING:    Sexuality: Discussed sexually transmitted diseases, partner selection, use of condoms, avoidance of unintended pregnancy  and contraceptive alternatives.   Advised to avoid cigarette smoking.  I discussed with the patient that most people either abstain from alcohol or drink within safe limits (<=14/week and <=4 drinks/occasion for males, <=7/weeks and <= 3 drinks/occasion for females) and that the risk for alcohol disorders and other health effects rises proportionally with the number of drinks per week and how often a drinker exceeds daily limits.  Discussed cessation/primary prevention of drug use and availability of treatment for abuse.   Diet: Encouraged to adjust caloric intake to maintain  or achieve ideal body  weight, to reduce intake of dietary saturated fat and total fat, to limit sodium intake by avoiding high sodium foods and not adding table salt, and to maintain adequate dietary potassium and calcium preferably from fresh fruits, vegetables, and low-fat dairy products.    stressed the importance of regular exercise  Injury prevention: Discussed safety belts, safety helmets, smoke detector, smoking near bedding or upholstery.   Dental health: Discussed importance of regular tooth brushing, flossing, and dental visits.   Follow up plan: NEXT PREVENTATIVE PHYSICAL DUE IN 1 YEAR.  No follow-ups on file.

## 2022-01-09 ENCOUNTER — Ambulatory Visit (INDEPENDENT_AMBULATORY_CARE_PROVIDER_SITE_OTHER): Payer: 59 | Admitting: Nurse Practitioner

## 2022-01-09 ENCOUNTER — Encounter: Payer: Self-pay | Admitting: Nurse Practitioner

## 2022-01-09 VITALS — BP 98/78 | HR 83 | Temp 97.5°F | Wt 144.0 lb

## 2022-01-09 DIAGNOSIS — Z0000 Encounter for general adult medical examination without abnormal findings: Secondary | ICD-10-CM

## 2022-01-09 DIAGNOSIS — Z0001 Encounter for general adult medical examination with abnormal findings: Secondary | ICD-10-CM

## 2022-01-09 DIAGNOSIS — R0602 Shortness of breath: Secondary | ICD-10-CM

## 2022-01-09 DIAGNOSIS — K862 Cyst of pancreas: Secondary | ICD-10-CM

## 2022-01-09 DIAGNOSIS — Z9109 Other allergy status, other than to drugs and biological substances: Secondary | ICD-10-CM | POA: Diagnosis not present

## 2022-01-09 DIAGNOSIS — Z13220 Encounter for screening for lipoid disorders: Secondary | ICD-10-CM

## 2022-01-09 DIAGNOSIS — F419 Anxiety disorder, unspecified: Secondary | ICD-10-CM

## 2022-01-09 LAB — LIPID PANEL
Cholesterol: 189 mg/dL (ref 0–200)
HDL: 65.1 mg/dL (ref >39.00)
LDL Cholesterol: 114 mg/dL — ABNORMAL HIGH (ref 0–99)
NonHDL: 124.36
Total CHOL/HDL Ratio: 3
Triglycerides: 53 mg/dL (ref 0.0–149.0)
VLDL: 10.6 mg/dL (ref 0.0–40.0)

## 2022-01-09 MED ORDER — PANTOPRAZOLE SODIUM 40 MG PO TBEC: 40.0000 mg | DELAYED_RELEASE_TABLET | Freq: Every day | ORAL | 2 refills | Status: DC

## 2022-01-09 NOTE — Progress Notes (Signed)
Carelink Summary Report / Loop Recorder 

## 2022-01-09 NOTE — Assessment & Plan Note (Signed)
He is experiencing ongoing nasal congestion, postnasal drip, cough.  We will have him continue Flonase or switch to Nasacort.  He can also switch to Xyzal from Claritin to see if this helps improve his symptoms.  Make sure he is drinking plenty of fluids.  Follow-up in 4 weeks.

## 2022-01-09 NOTE — Patient Instructions (Addendum)
It was great to see you!  Trial xyzal over the counter to help with your allergies. Keep doing the flonase daily or switch to nasacort to see if this helps better.   Start pantoprazole (protonix) one tablet daily to help with acid reflux and see if it helps with shortness of breath. I have also ordered a CT scan, they will call to schedule this.   Let's follow-up in 4-6 weeks, sooner if you have concerns.  If a referral was placed today, you will be contacted for an appointment. Please note that routine referrals can sometimes take up to 3-4 weeks to process. Please call our office if you haven't heard anything after this time frame.  Take care,  Vance Peper, NP

## 2022-01-09 NOTE — Assessment & Plan Note (Addendum)
Chronic, stable.  He is following with psychiatry and is taking Lexapro 10 mg daily along with titrating up to BuSpar 10 mg twice a day.  Continue collaboration recommendations from psychiatry.

## 2022-01-09 NOTE — Assessment & Plan Note (Signed)
He is still having ongoing shortness of breath.  He states that his anxiety is better controlled and does not think it is from this.  He is also having some postnasal drip, nasal congestion.  He has tried Claritin, Flonase, Benadryl.  We will have him continue the Flonase or try Nasacort.  He can also switch from Claritin to Xyzal 5-10 mg daily.  We will also start him on pantoprazole 40 mg daily to see if any reflux is causing some of the symptoms.  With ongoing shortness of breath, will also order a chest CT.

## 2022-01-21 ENCOUNTER — Ambulatory Visit (INDEPENDENT_AMBULATORY_CARE_PROVIDER_SITE_OTHER): Payer: 59

## 2022-01-21 DIAGNOSIS — R55 Syncope and collapse: Secondary | ICD-10-CM

## 2022-01-22 LAB — CUP PACEART REMOTE DEVICE CHECK
Date Time Interrogation Session: 20231001230556
Implantable Pulse Generator Implant Date: 20210810

## 2022-02-04 ENCOUNTER — Ambulatory Visit
Admission: RE | Admit: 2022-02-04 | Discharge: 2022-02-04 | Disposition: A | Discharge status: Home / Self Care | Payer: 59 | Source: Ambulatory Visit | Attending: Nurse Practitioner | Admitting: Nurse Practitioner

## 2022-02-04 DIAGNOSIS — R0602 Shortness of breath: Secondary | ICD-10-CM

## 2022-02-04 NOTE — Progress Notes (Signed)
Carelink Summary Report / Loop Recorder 

## 2022-02-05 NOTE — Addendum Note (Signed)
Addended by: Vance Peper A on: 02/05/2022 05:22 PM   Modules accepted: Orders

## 2022-02-06 ENCOUNTER — Ambulatory Visit (INDEPENDENT_AMBULATORY_CARE_PROVIDER_SITE_OTHER): Payer: 59 | Admitting: Nurse Practitioner

## 2022-02-06 ENCOUNTER — Encounter: Payer: Self-pay | Admitting: Nurse Practitioner

## 2022-02-06 VITALS — BP 108/74 | HR 77 | Temp 97.9°F | Wt 147.4 lb

## 2022-02-06 DIAGNOSIS — R0602 Shortness of breath: Secondary | ICD-10-CM | POA: Diagnosis not present

## 2022-02-06 DIAGNOSIS — K862 Cyst of pancreas: Secondary | ICD-10-CM | POA: Diagnosis not present

## 2022-02-06 NOTE — Assessment & Plan Note (Signed)
Chronic, has been improving over the past 3 days.  CT scan of his chest was negative, stress test was normal.  He declines referral to pulmonology as testing has been negative.  Follow-up in 6 months or sooner with concerns.

## 2022-02-06 NOTE — Assessment & Plan Note (Signed)
Incidental finding noted on the CT scan of his chest.  Will order MRI for further evaluation.

## 2022-02-06 NOTE — Patient Instructions (Signed)
It was great to see you!  I have ordered the MRI, they will call to schedule.   Let's follow-up in 6 months, sooner if you have concerns.  If a referral was placed today, you will be contacted for an appointment. Please note that routine referrals can sometimes take up to 3-4 weeks to process. Please call our office if you haven't heard anything after this time frame.  Take care,  Vance Peper, NP

## 2022-02-06 NOTE — Progress Notes (Signed)
   Established Patient Office Visit  Subjective   Patient ID: Lucas Austin, male    DOB: 16-Feb-1991  Age: 31 y.o. MRN: [id number]  Chief Complaint  Patient presents with   Follow-up    4 wk f/u post CT scan, SOB    HPI  Lucas Austin is here to follow-up on shortness of breath.  He states that it might of gotten a little bit better over the past few days.  He had a CT scan of his chest which was negative for any pulmonary findings and a stress test which was negative for heart disease.  The CT did show an incidental finding of a cyst on his pancreas tail.  He denies abdominal pain and decreased appetite.    ROS See pertinent positives and negatives per HPI.    Objective:     BP 108/74   Pulse 77   Temp 97.9 F (36.6 C) (Temporal)   Wt 147 lb 6.4 oz (66.9 kg)   SpO2 99%   BMI 23.09 kg/m    Physical Exam Vitals and nursing note reviewed.  Constitutional:      Appearance: Normal appearance.  HENT:     Head: Normocephalic.     Mouth/Throat:     Mouth: Mucous membranes are moist.     Pharynx: No oropharyngeal exudate or posterior oropharyngeal erythema.  Eyes:     Conjunctiva/sclera: Conjunctivae normal.  Cardiovascular:     Rate and Rhythm: Normal rate and regular rhythm.     Pulses: Normal pulses.     Heart sounds: Normal heart sounds.  Pulmonary:     Effort: Pulmonary effort is normal.     Breath sounds: Normal breath sounds.  Musculoskeletal:     Cervical back: Normal range of motion.  Skin:    General: Skin is warm.  Neurological:     General: No focal deficit present.     Mental Status: He is alert and oriented to person, place, and time.  Psychiatric:        Mood and Affect: Mood normal.        Behavior: Behavior normal.        Thought Content: Thought content normal.        Judgment: Judgment normal.      Assessment & Plan:   Problem List Items Addressed This Visit       Digestive   Pancreatic cyst    Incidental finding  noted on the CT scan of his chest.  Will order MRI for further evaluation.        Other   Shortness of breath - Primary    Chronic, has been improving over the past 3 days.  CT scan of his chest was negative, stress test was normal.  He declines referral to pulmonology as testing has been negative.  Follow-up in 6 months or sooner with concerns.       Return in about 6 months (around 08/08/2022) for shortness of breath.    Charyl Dancer, NP

## 2022-02-25 ENCOUNTER — Ambulatory Visit (INDEPENDENT_AMBULATORY_CARE_PROVIDER_SITE_OTHER): Payer: 59

## 2022-02-25 DIAGNOSIS — R55 Syncope and collapse: Secondary | ICD-10-CM | POA: Diagnosis not present

## 2022-02-25 LAB — CUP PACEART REMOTE DEVICE CHECK
Date Time Interrogation Session: 20231105230333
Implantable Pulse Generator Implant Date: 20210810

## 2022-03-02 ENCOUNTER — Ambulatory Visit
Admission: RE | Admit: 2022-03-02 | Discharge: 2022-03-02 | Disposition: A | Discharge status: Home / Self Care | Payer: 59 | Source: Ambulatory Visit | Attending: Nurse Practitioner | Admitting: Nurse Practitioner

## 2022-03-02 DIAGNOSIS — K862 Cyst of pancreas: Secondary | ICD-10-CM

## 2022-03-02 MED ORDER — GADOPICLENOL 0.5 MMOL/ML IV SOLN
7.0000 mL | Freq: Once | INTRAVENOUS | Status: AC | PRN
  Administered 2022-03-02: 7 mL via INTRAVENOUS

## 2022-03-04 ENCOUNTER — Other Ambulatory Visit: Payer: Self-pay | Admitting: Nurse Practitioner

## 2022-03-04 DIAGNOSIS — K862 Cyst of pancreas: Secondary | ICD-10-CM

## 2022-03-07 ENCOUNTER — Ambulatory Visit (INDEPENDENT_AMBULATORY_CARE_PROVIDER_SITE_OTHER): Payer: 59 | Admitting: Gastroenterology

## 2022-03-07 ENCOUNTER — Encounter: Payer: Self-pay | Admitting: Gastroenterology

## 2022-03-07 VITALS — BP 100/60 | HR 100 | Ht 67.0 in | Wt 149.0 lb

## 2022-03-07 DIAGNOSIS — K862 Cyst of pancreas: Secondary | ICD-10-CM | POA: Diagnosis not present

## 2022-03-07 NOTE — Progress Notes (Signed)
HPI :  31 year old male with a history of palpitations, syncope, pancreatic cyst, referred here by Rodman Pickle, NP for pancreatic cyst evaluation.  Patient reports history of palpitations, syncope.  He has had a Holter monitor, loop recorder in place with cardiology and etiology for symptoms has remained unclear.  Echocardiogram has looked okay, stress test okay.  He had some ongoing exertional dyspnea as well, this led to a CT scan of his chest on October 17.  No concerning pathology noted in the chest but incidentally was noted to have a 3.1 x 2.3 cm cystic lesion at the tail the pancreas.  This was this was subsequently followed up with an MRI of the abdomen, showing a 3.3 x 2.2 cm cyst of the pancreatic tail.  There are few thin internal septations but no soft tissue nodularity or ductal dilation.  Radiology recommended EUS versus close surveillance MRI.  This appears to be an incidental finding as he has had no symptoms regarding this over time.  He has no abdominal pains.  He eats well.  He has had intentional weight loss with dieting recently but no unintentional symptoms.  He has never had pancreatitis in the past or pancreas problem.  He thinks his grandmother may have had a pancreatic cyst or lesion in the pancreas but no known family history of pancreatic cancer.  He states he continues to have some baseline dyspnea but work-up has been negative, he states he was told it might be due to anxiety.  He denies any problems with recurrent syncope lately.  He has never had anesthesia in the past.  Prior work-up: CT chest 02/04/22: IMPRESSION: 1. No acute intrathoracic pathology. 2. Cystic lesion of the tail of the pancreas. Further characterization with MRI without and with contrast is recommended.  MRI abdomen 03/04/22: IMPRESSION: Cystic lesion in the pancreatic tail measures 3.3 x 2.2 cm and demonstrates a few thin internal enhancing septations measuring up to 1-2 mm in thickness but  no enhancing soft tissue nodularity or wall/septal thickening. No pancreatic ductal dilation. Differential considerations include a cystic pancreatic neoplasm such as a serous cystadenoma, side branch IPMN or confluence pseudocysts. Consider further evaluation with FNA/EUS fluid sampling and follow up pre and post contrast MRI/MRCP in 6 months to assess stability.  Echocardiogram 11/18/2019: EF 60-65%    Past Medical History:  Diagnosis Date   Palpitation    PVC's (premature ventricular contractions)      Past Surgical History:  Procedure Laterality Date   implantable loop recorder placement  11/30/2019   Medtronic Reveal Linq model LNQ22 implantable loop recorder (SN [id number]) by Dr Johney Frame for syncope evaluation   Family History  Problem Relation Age of Onset   Pulmonary fibrosis Mother    Diabetes Maternal Grandmother    Aneurysm Maternal Grandfather    Aneurysm Paternal Grandfather    Social History   Tobacco Use   Smoking status: Never   Smokeless tobacco: Never  Vaping Use   Vaping Use: Never used  Substance Use Topics   Alcohol use: Never   Drug use: Never   No current outpatient medications on file.   No current facility-administered medications for this visit.   No Known Allergies   Review of Systems: All systems reviewed and negative except where noted in HPI.    MR Abdomen W Wo Contrast  Result Date: 03/04/2022 CLINICAL DATA:  Further evaluation of pancreatic cyst. EXAM: MRI ABDOMEN WITHOUT AND WITH CONTRAST TECHNIQUE: Multiplanar multisequence MR imaging of the abdomen  was performed both before and after the administration of intravenous contrast. CONTRAST:  7 cc of Vueway COMPARISON:  CT chest February 04, 2022. FINDINGS: Lower chest: No acute abnormality. Hepatobiliary: No significant hepatic steatosis. No suspicious hepatic lesion. Gallbladder is unremarkable. No biliary ductal dilation. Pancreas: Cystic lesion in the pancreatic tail measures 3.3  x 2.2 cm on image 13/5. It demonstrates a few thin internal enhancing septations measuring up to 1-2 mm in thickness but no enhancing soft tissue nodularity or wall/septal thickening. No pancreatic ductal dilation. No evidence of acute inflammation. Spleen:  No splenomegaly or focal splenic lesion. Adrenals/Urinary Tract: No masses identified. No evidence of hydronephrosis. Stomach/Bowel: Visualized portions within the abdomen are unremarkable. Vascular/Lymphatic: No pathologically enlarged lymph nodes identified. No abdominal aortic aneurysm demonstrated. Other:  None. Musculoskeletal: No suspicious bone lesions identified. IMPRESSION: Cystic lesion in the pancreatic tail measures 3.3 x 2.2 cm and demonstrates a few thin internal enhancing septations measuring up to 1-2 mm in thickness but no enhancing soft tissue nodularity or wall/septal thickening. No pancreatic ductal dilation. Differential considerations include a cystic pancreatic neoplasm such as a serous cystadenoma, side branch IPMN or confluence pseudocysts. Consider further evaluation with FNA/EUS fluid sampling and follow up pre and post contrast MRI/MRCP in 6 months to assess stability. Electronically Signed   By: Dahlia Bailiff M.D.   On: 03/04/2022 10:40   CUP PACEART REMOTE DEVICE CHECK  Result Date: 02/25/2022 ILR summary report received. Battery status OK. Normal device function. No new symptom, tachy, brady, or pause episodes. No new AF episodes. Monthly summary reports and ROV/PRN LA   Lab Results  Component Value Date   WBC 4.9 12/18/2021   HGB 15.0 12/18/2021   HCT 44.0 12/18/2021   MCV 85.1 12/18/2021   PLT 268.0 12/18/2021    Lab Results  Component Value Date   CREATININE 1.00 12/18/2021   BUN 15 12/18/2021   NA 139 12/18/2021   K 3.9 12/18/2021   CL 105 12/18/2021   CO2 22 12/18/2021    Lab Results  Component Value Date   ALT 29 12/18/2021   AST 17 12/18/2021   ALKPHOS 70 12/18/2021   BILITOT 1.0 12/18/2021      Physical Exam: BP 100/60   Pulse 100   Ht 5\' 7"  (1.702 m)   Wt 149 lb (67.6 kg)   SpO2 98%   BMI 23.34 kg/m  Constitutional: Pleasant,well-developed, male in no acute distress. HEENT: Normocephalic and atraumatic. Conjunctivae are normal. No scleral icterus. Neck supple.  Cardiovascular: Normal rate, regular rhythm.  Pulmonary/chest: Effort normal and breath sounds normal. No wheezing, rales or rhonchi. Abdominal: Soft, nondistended, nontender.  There are no masses palpable.  Extremities: no edema Lymphadenopathy: No cervical adenopathy noted. Neurological: Alert and oriented to person place and time. Skin: Skin is warm and dry. No rashes noted. Psychiatric: Normal mood and affect. Behavior is normal.   ASSESSMENT: 31 y.o. male here for assessment of the following  1. Pancreatic cyst    >3 cm cyst in the pancreatic tail noted incidentally on CT chest, subsequently followed up with MRI as outlined.  Discussed differential diagnosis for the cyst with the patient.  This may likely be a serous cystadenoma, which would be reassuring and would not warrant any further follow-up, however sidebranch IPMN or other pseudocyst also remains in the differential diagnosis.  Given the size of the cyst and the patient's age I think an EUS is reasonable to further evaluate and clarify what this is in regards  to long-term surveillance.  The other option here would be a close surveillance MRCP.  I discussed what EUS is with him, risks of the procedure.  He is leaning towards EUS however wants to think about this.  I will need to discuss his case with Dr. Rush Landmark to see if he agrees with EUS, and if so can proceed with that.  The patient was tentatively scheduled for an EUS in January with Dr. Rush Landmark, however the patient will call back in the next week or 2 to confirm if he wants to proceed with this and I will also discuss his case with Dr. Rush Landmark to make sure he is agreeable with this plan.   If the patient declines EUS then he would be okay with pursuing a short interval follow-up MRCP.    PLAN: - I will speak with Dr. Rush Landmark about this case in regards to need for EUS, tentatively schedule for EUS with Mansour[place] in January, given size of the cyst and age of patient - patient will call us to confirm if he wishes to proceed with EUS or not in the next 2 weeks  - if he declines EUS, would pursue close follow up surveillance MRCP   Jolly Mango, MD [facility]  CC: Charyl Dancer, NP

## 2022-03-07 NOTE — Patient Instructions (Addendum)
If you are age 32 or older, your body mass index should be between 23-30. Your Body mass index is 23.34 kg/m. If this is out of the aforementioned range listed, please consider follow up with your Primary Care Provider.  If you are age 60 or younger, your body mass index should be between 19-25. Your Body mass index is 23.34 kg/m. If this is out of the aformentioned range listed, please consider follow up with your Primary Care Provider.   ________________________________________________________  Bonita Quin have been scheduled for an EUS with Dr. Meridee Score on  January 18th. Please follow written instructions given to you at your visit today. If you use inhalers (even only as needed), please bring them with you on the day of your procedure.   Thank you for entrusting me with your care and for choosing [facility], Dr. Ileene Patrick

## 2022-03-08 ENCOUNTER — Telehealth: Payer: Self-pay | Admitting: Gastroenterology

## 2022-03-08 NOTE — Telephone Encounter (Signed)
Spoke with Dr. Meridee Score about this case, agrees that EUS is reasonable next step in his care. He is currently scheduled for 1/18 for this. The patient will contact us if he changes his intent to proceed with this for some reason in the interim.

## 2022-03-25 NOTE — Progress Notes (Signed)
Carelink Summary Report / Loop Recorder 

## 2022-04-01 ENCOUNTER — Ambulatory Visit (INDEPENDENT_AMBULATORY_CARE_PROVIDER_SITE_OTHER): Payer: 59

## 2022-04-01 DIAGNOSIS — R55 Syncope and collapse: Secondary | ICD-10-CM

## 2022-04-01 LAB — CUP PACEART REMOTE DEVICE CHECK
Date Time Interrogation Session: 20231210231103
Implantable Pulse Generator Implant Date: 20210810

## 2022-05-02 ENCOUNTER — Encounter (HOSPITAL_COMMUNITY): Payer: Self-pay | Admitting: Gastroenterology

## 2022-05-06 ENCOUNTER — Ambulatory Visit (INDEPENDENT_AMBULATORY_CARE_PROVIDER_SITE_OTHER): Payer: 59

## 2022-05-06 DIAGNOSIS — R55 Syncope and collapse: Secondary | ICD-10-CM

## 2022-05-06 NOTE — Progress Notes (Signed)
Carelink Summary Report / Loop Recorder

## 2022-05-07 ENCOUNTER — Telehealth: Payer: Self-pay | Admitting: Gastroenterology

## 2022-05-07 LAB — CUP PACEART REMOTE DEVICE CHECK
Date Time Interrogation Session: 20240112230532
Implantable Pulse Generator Implant Date: 20210810

## 2022-05-07 NOTE — Telephone Encounter (Signed)
Patient needing to reschedule hospital procedure. Please advise.

## 2022-05-07 NOTE — Telephone Encounter (Signed)
The pt has been scheduled for 06/27/22 at 830 am due to no transportation for 1/18.  New instructions have been sent to the patient via My Chart. He was also instructed via phone.

## 2022-06-07 LAB — CUP PACEART REMOTE DEVICE CHECK
Date Time Interrogation Session: 20240214231059
Implantable Pulse Generator Implant Date: 20210810

## 2022-06-10 ENCOUNTER — Ambulatory Visit: Payer: 59

## 2022-06-10 DIAGNOSIS — R55 Syncope and collapse: Secondary | ICD-10-CM | POA: Diagnosis not present

## 2022-06-10 DIAGNOSIS — R002 Palpitations: Secondary | ICD-10-CM

## 2022-06-13 NOTE — Progress Notes (Signed)
Carelink Summary Report / Loop Recorder 

## 2022-06-20 ENCOUNTER — Encounter (HOSPITAL_COMMUNITY): Payer: Self-pay | Admitting: Gastroenterology

## 2022-06-26 ENCOUNTER — Ambulatory Visit: Payer: 59 | Attending: Cardiology | Admitting: Cardiology

## 2022-06-26 ENCOUNTER — Encounter: Payer: Self-pay | Admitting: Cardiology

## 2022-06-26 ENCOUNTER — Telehealth: Payer: Self-pay

## 2022-06-26 VITALS — BP 110/70 | HR 77 | Ht 67.0 in | Wt 150.0 lb

## 2022-06-26 DIAGNOSIS — R55 Syncope and collapse: Secondary | ICD-10-CM | POA: Diagnosis not present

## 2022-06-26 NOTE — Progress Notes (Signed)
Electrophysiology Office Note   Date:  06/26/2022   ID:  Lucas Austin, Lucas Austin 05-19-1990, MRN [id number]  PCP:  Charyl Dancer, NP  Cardiologist:  Radford Pax Primary Electrophysiologist:  Lashon Hillier Meredith Leeds, MD    Chief Complaint: syncope, ILR   History of Present Illness: Lucas Austin is a 32 y.o. male who is being seen today for the evaluation of syncope, ILR at the request of McElwee, Lauren A, NP. Presenting today for electrophysiology evaluation.  He has a history significant for syncope and PVCs.  He has an ILR implanted in 2021.  He continues to have episodes of shortness of breath.  He had a cardiopulmonary exercise test that showed no major abnormality.  Today, he denies symptoms of palpitations, chest pain, shortness of breath, orthopnea, PND, lower extremity edema, claudication, dizziness, presyncope, syncope, bleeding, or neurologic sequela. The patient is tolerating medications without difficulties.    Past Medical History:  Diagnosis Date   Palpitation    PVC's (premature ventricular contractions)    Past Surgical History:  Procedure Laterality Date   implantable loop recorder placement  11/30/2019   Medtronic Reveal Linq model [id number] implantable loop recorder (SN [id number]) by Dr Rayann Heman for syncope evaluation     Current Outpatient Medications  Medication Sig Dispense Refill   acetaminophen (TYLENOL) 500 MG tablet Take 500 mg by mouth every 6 (six) hours as needed (pain.).     ALPRAZolam (XANAX) 0.25 MG tablet Take 0.25 mg by mouth 2 (two) times daily as needed for anxiety.     BIOTIN PO Take 1 tablet by mouth 2 (two) times a week.     ibuprofen (ADVIL) 200 MG tablet Take 200 mg by mouth every 8 (eight) hours as needed (pain.).     No current facility-administered medications for this visit.    Allergies:   Patient has no known allergies.   Social History:  The patient  reports that he has never smoked. He has never used smokeless tobacco.  He reports that he does not drink alcohol and does not use drugs.   Family History:  The patient's family history includes Aneurysm in his maternal grandfather and paternal grandfather; Diabetes in his maternal grandmother; Pulmonary fibrosis in his mother.    ROS:  Please see the history of present illness.   Otherwise, review of systems is positive for none.   All other systems are reviewed and negative.    PHYSICAL EXAM: VS:  BP 110/70   Pulse 77   Ht '5\' 7"'$  (1.702 m)   Wt 150 lb (68 kg)   SpO2 98%   BMI 23.49 kg/m  , BMI Body mass index is 23.49 kg/m. GEN: Well nourished, well developed, in no acute distress  HEENT: normal  Neck: no JVD, carotid bruits, or masses Cardiac: RRR; no murmurs, rubs, or gallops,no edema  Respiratory:  clear to auscultation bilaterally, normal work of breathing GI: soft, nontender, nondistended, + BS MS: no deformity or atrophy  Skin: warm and dry, device pocket is well healed Neuro:  Strength and sensation are intact Psych: euthymic mood, full affect  EKG:  EKG is ordered today. Personal review of the ekg ordered shows sinus rhythm  Device interrogation is reviewed today in detail.  See PaceArt for details.   Recent Labs: 12/18/2021: ALT 29; BUN 15; Creatinine, Ser 1.00; Hemoglobin 15.0; Platelets 268.0; Potassium 3.9; Sodium 139; TSH 2.07    Lipid Panel     Component Value Date/Time   CHOL 189 01/09/2022  0920   TRIG 53.0 01/09/2022 0920   HDL 65.10 01/09/2022 0920   CHOLHDL 3 01/09/2022 0920   VLDL 10.6 01/09/2022 0920   LDLCALC 114 (H) 01/09/2022 0920     Wt Readings from Last 3 Encounters:  06/26/22 150 lb (68 kg)  03/07/22 149 lb (67.6 kg)  02/06/22 147 lb 6.4 oz (66.9 kg)      Other studies Reviewed: Additional studies/ records that were reviewed today include: TTE 2021  Review of the above records today demonstrates:   1. Normal GLS -23.1. Left ventricular ejection fraction, by estimation,  is 60 to 65%. The left  ventricle has normal function. The left ventricle  has no regional wall motion abnormalities. Left ventricular diastolic  parameters were normal.   2. Right ventricular systolic function is normal. The right ventricular  size is normal.   3. Left atrial size was mildly dilated.   4. The mitral valve is normal in structure. No evidence of mitral valve  regurgitation. No evidence of mitral stenosis.   5. The aortic valve is tricuspid. Aortic valve regurgitation is not  visualized. No aortic stenosis is present.   6. The inferior vena cava is normal in size with greater than 50%  respiratory variability, suggesting right atrial pressure of 3 mmHg.     ASSESSMENT AND PLAN:  1.  Syncope/palpitations: Status post Medtronic ILR implant.  Device functioning appropriately.  Low burden of PVCs on current monitor.  No arrhythmias to suggest cause for syncope.  Bensen Chadderdon continue to monitor.  He may wish for his device to be explanted in the future, but he is okay with it now.    Current medicines are reviewed at length with the patient today.   The patient does not have concerns regarding his medicines.  The following changes were made today:  none  Labs/ tests ordered today include:  Orders Placed This Encounter  Procedures   EKG 12-Lead     Disposition:   FU 6 months  Signed, Nikia Levels Meredith Leeds, MD  06/26/2022 11:18 AM     [facility] [address] [place] [address] [phone] (office) [phone] (fax)

## 2022-06-26 NOTE — Patient Instructions (Addendum)
Medication Instructions:  Your physician recommends that you continue on your current medications as directed. Please refer to the Current Medication list given to you today.  *If you need a refill on your cardiac medications before your next appointment, please call your pharmacy*   Lab Work: None ordered   Testing/Procedures: None ordered   Follow-Up: At [facility], you and your health needs are our priority.  As part of our continuing mission to provide you with exceptional heart care, we have created designated Provider Care Teams.  These Care Teams include your primary Cardiologist (physician) and Advanced Practice Providers (APPs -  Physician Assistants and Nurse Practitioners) who all work together to provide you with the care you need, when you need it.  Your next appointment:   1year  The format for your next appointment:   In Person  Provider:   Legrand Como "Jonni Sanger" Chalmers Cater, PA-C    Thank you for choosing [facility]!!   Trinidad Curet, RN [phone]

## 2022-06-26 NOTE — Progress Notes (Signed)
Patient called 06/26/22 stating he is still not feeling well and has tested positive for COVID. Advised patient that I would cancel his procedure for 06/27/22 and notify Dr Donneta Romberg office. Patient verbalized understanding.

## 2022-06-26 NOTE — Telephone Encounter (Signed)
See message below from [place] at [place], Lucas Austin just called endo to cancel for tomorrow's procedure; he states he's still not feeling well and has tested positive for COVID. (although I do see he saw cardiology today in the office) I will cancel. Thanks

## 2022-06-26 NOTE — Telephone Encounter (Signed)
Thanks Patty for letting us know. This is the second time the patient has cancelled on GM. If he does this again without advanced notice, he may need to seek care elsewhere, although he will get a pass if truly due to COVID

## 2022-06-27 ENCOUNTER — Ambulatory Visit (HOSPITAL_COMMUNITY): Admission: RE | Admit: 2022-06-27 | Payer: 59 | Source: Home / Self Care | Admitting: Gastroenterology

## 2022-06-27 SURGERY — UPPER ESOPHAGEAL ENDOSCOPIC ULTRASOUND (EUS)
Anesthesia: Monitor Anesthesia Care

## 2022-06-27 NOTE — Telephone Encounter (Signed)
Left message on machine to call back  

## 2022-06-28 NOTE — Telephone Encounter (Signed)
The pt phone goes straight to a message that voice mail has not been set up I will mail a letter and send to My Chart

## 2022-07-04 ENCOUNTER — Ambulatory Visit (INDEPENDENT_AMBULATORY_CARE_PROVIDER_SITE_OTHER): Payer: 59 | Admitting: Nurse Practitioner

## 2022-07-04 ENCOUNTER — Encounter: Payer: Self-pay | Admitting: Nurse Practitioner

## 2022-07-04 VITALS — BP 110/80 | HR 75 | Temp 97.6°F | Ht 67.0 in | Wt 147.8 lb

## 2022-07-04 DIAGNOSIS — R0602 Shortness of breath: Secondary | ICD-10-CM

## 2022-07-04 DIAGNOSIS — K862 Cyst of pancreas: Secondary | ICD-10-CM | POA: Diagnosis not present

## 2022-07-04 NOTE — Progress Notes (Signed)
   Established Patient Office Visit  Subjective   Patient ID: Lucas Austin, male    DOB: 1990/04/30  Age: 32 y.o. MRN: [id number]  Chief Complaint  Patient presents with   Shortness of Breath    Follow up   HPI:  Lucas Austin is here to follow-up on shortness of breath.  He is still having this intermittently with some days worse than others.  He states that his breathing is doing better today, however yesterday was a bad day.  He had tried albuterol inhaler which did not help.  This is associated with chest tightness when he is having shortness of breath.  His CT scan was negative for his lungs.  He states that he is having some allergy issues and is taking Claritin or Zyrtec daily as needed.  This tends to help with this.  He is interested in a referral to pulmonology today.    ROS See pertinent positives and negatives per HPI.    Objective:     BP 110/80 (BP Location: Right Arm)   Pulse 75   Temp 97.6 F (36.4 C)   Ht 5\' 7"  (1.702 m)   Wt 147 lb 12.8 oz (67 kg)   SpO2 97%   BMI 23.15 kg/m    Physical Exam Vitals and nursing note reviewed.  Constitutional:      Appearance: Normal appearance.  HENT:     Head: Normocephalic.  Eyes:     Conjunctiva/sclera: Conjunctivae normal.  Cardiovascular:     Rate and Rhythm: Normal rate and regular rhythm.     Pulses: Normal pulses.     Heart sounds: Normal heart sounds.  Pulmonary:     Effort: Pulmonary effort is normal.     Breath sounds: Normal breath sounds.  Musculoskeletal:     Cervical back: Normal range of motion.  Skin:    General: Skin is warm.  Neurological:     General: No focal deficit present.     Mental Status: He is alert and oriented to person, place, and time.  Psychiatric:        Mood and Affect: Mood normal.        Behavior: Behavior normal.        Thought Content: Thought content normal.        Judgment: Judgment normal.      Assessment & Plan:   Problem List Items Addressed  This Visit       Digestive   Pancreatic cyst    Noted incidentally on CT scan of chest.  He saw GI and was scheduled for an endoscopic ultrasound, however this was canceled.  He states that he is not concerned about this as GI thinks it is just a cyst.        Other   Shortness of breath - Primary    Chronic, ongoing.  This has been going on for at least 6 months at this point.  CT scan of his chest was negative, he had a stress test with cardiology that was normal.  He also has a loop recorder implanted that shows occasional PVCs.  He was given albuterol which did not help.  Will place a referral to pulmonology with ongoing symptoms.      Relevant Orders   Ambulatory referral to Pulmonology    Return in about 6 months (around 01/04/2023) for CPE.    Charyl Dancer, NP

## 2022-07-04 NOTE — Assessment & Plan Note (Signed)
Noted incidentally on CT scan of chest.  He saw GI and was scheduled for an endoscopic ultrasound, however this was canceled.  He states that he is not concerned about this as GI thinks it is just a cyst.

## 2022-07-04 NOTE — Patient Instructions (Addendum)
It was great to see you!  Continue zyrtec or claritin as needed for allergies.   I have placed a referral to the pulmonologist (lung doctor) for your shortness of breath.   Double check and see when your last tetanus (Td or Tdap) shot was.   Let's follow-up in 6 months, sooner if you have concerns.  If a referral was placed today, you will be contacted for an appointment. Please note that routine referrals can sometimes take up to 3-4 weeks to process. Please call our office if you haven't heard anything after this time frame.  Take care,  Vance Peper, NP

## 2022-07-04 NOTE — Assessment & Plan Note (Signed)
Chronic, ongoing.  This has been going on for at least 6 months at this point.  CT scan of his chest was negative, he had a stress test with cardiology that was normal.  He also has a loop recorder implanted that shows occasional PVCs.  He was given albuterol which did not help.  Will place a referral to pulmonology with ongoing symptoms.

## 2022-07-10 ENCOUNTER — Encounter: Payer: Self-pay | Admitting: Pulmonary Disease

## 2022-07-10 ENCOUNTER — Ambulatory Visit (INDEPENDENT_AMBULATORY_CARE_PROVIDER_SITE_OTHER): Payer: 59 | Admitting: Pulmonary Disease

## 2022-07-10 VITALS — BP 122/74 | HR 74 | Ht 67.0 in | Wt 145.0 lb

## 2022-07-10 DIAGNOSIS — R0982 Postnasal drip: Secondary | ICD-10-CM | POA: Diagnosis not present

## 2022-07-10 DIAGNOSIS — R0602 Shortness of breath: Secondary | ICD-10-CM | POA: Diagnosis not present

## 2022-07-10 MED ORDER — FLUTICASONE-SALMETEROL 115-21 MCG/ACT IN AERO: 2.0000 | INHALATION_SPRAY | Freq: Two times a day (BID) | RESPIRATORY_TRACT | 12 refills | Status: DC

## 2022-07-10 NOTE — Patient Instructions (Signed)
Try advair inhaler 2 puffs twice daily - rinse mouth out after each use  Use flonase nasal spray, 1 spray per nostril daily  Start allegra 180mg  daily for runny nose and allergies  Follow up in 2 months for breathing tests

## 2022-07-10 NOTE — Progress Notes (Unsigned)
Synopsis: Referred in March 2024 for shortness of breath by Vance Peper, NP  Subjective:   PATIENT ID: Lucas Austin DOB: 11/23/1990, MRN: [id number]  HPI  Chief Complaint  Patient presents with   Consult    Referred by PCP for increased SOB for the past 6 months. States the SOB is constant. Increased non-productive cough.    Lucas Austin is a 32 year old [place], never smoker who is referred to pulmonary clinic for shortness of breath.   He reports having shortness of breath for 6 months. He has tried albuterol without much relief. Possibly had viral infection that preceded it. No wheezing. He coughs a lot at night that will wake him up from sleep, 2-3 nights per week. No issues with heartburn or reflux.   He is seeing a psychiatrist for anxiety. He is on buspar and xanax as needed.  He has seasonal allergies that affect his sinuses and lead to post nasal drainage.   Seen by EP 06/26/22, has implanted loop recorder. No arrhythmias noted.   Has normal cardiopulmonary exercise test and CT chest last year.   He was exposed to second hand smoke in childhood. No issues with his breathing in childhood. 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 No harmful dust or chemical exposures. No hobbies with dust exposures. His mom had pulmonary fibrosis from an infection.    Past Medical History:  Diagnosis Date   Palpitation    PVC's (premature ventricular contractions)      Family History  Problem Relation Age of Onset   Pulmonary fibrosis Mother    Diabetes Maternal Grandmother    Aneurysm Maternal Grandfather    Aneurysm Paternal Grandfather      Social History   Socioeconomic History   Marital status: Married    Spouse name: Not on file   Number of children: Not on file   Years of education: Not on file   Highest education level: Not on file  Occupational History   Not on file  Tobacco Use   Smoking status: Never   Smokeless tobacco: Never  Vaping Use    Vaping Use: Never used  Substance and Sexual Activity   Alcohol use: Never   Drug use: Never   Sexual activity: Yes    Comment: wife has nexplanon  Other Topics Concern   Not on file  Social History Narrative   [place]   Works in Herbalist   Social Determinants of Health   Financial Resource Strain: Not on file  Food Insecurity: Not on file  Transportation Needs: Not on file  Physical Activity: Not on file  Stress: Not on file  Social Connections: Not on file  Intimate Partner Violence: Not on file     No Known Allergies   Outpatient Medications Prior to Visit  Medication Sig Dispense Refill   ALPRAZolam (XANAX) 0.25 MG tablet Take 0.25 mg by mouth 2 (two) times daily as needed for anxiety.     BIOTIN PO Take 1 tablet by mouth 2 (two) times a week.     busPIRone (BUSPAR) 5 MG tablet Take 5 mg by mouth 2 (two) times daily.     ibuprofen (ADVIL) 200 MG tablet Take 200 mg by mouth every 8 (eight) hours as needed (pain.).     acetaminophen (TYLENOL) 500 MG tablet Take 500 mg by mouth every 6 (six) hours as needed (pain.). (Patient not taking: Reported on 07/04/2022)     No  facility-administered medications prior to visit.   Review of Systems  Constitutional:  Negative for chills, fever, malaise/fatigue and weight loss.  HENT:  Positive for congestion. Negative for sinus pain and sore throat.   Eyes: Negative.   Respiratory:  Positive for shortness of breath. Negative for cough, hemoptysis, sputum production and wheezing.   Cardiovascular:  Negative for chest pain, palpitations, orthopnea, claudication and leg swelling.  Gastrointestinal:  Negative for abdominal pain, heartburn, nausea and vomiting.  Genitourinary: Negative.   Musculoskeletal:  Negative for joint pain and myalgias.  Skin:  Negative for rash.  Neurological:  Negative for weakness.  Endo/Heme/Allergies: Negative.   Psychiatric/Behavioral: Negative.     Objective:   Vitals:    07/10/22 1308  BP: 122/74  Pulse: 74  SpO2: 99%  Weight: 145 lb (65.8 kg)  Height: 5\' 7"  (1.702 m)     Physical Exam Constitutional:      General: He is not in acute distress. HENT:     Head: Normocephalic and atraumatic.     Nose: Rhinorrhea present.  Eyes:     Extraocular Movements: Extraocular movements intact.     Conjunctiva/sclera: Conjunctivae normal.     Pupils: Pupils are equal, round, and reactive to light.  Cardiovascular:     Rate and Rhythm: Normal rate and regular rhythm.     Pulses: Normal pulses.     Heart sounds: Normal heart sounds. No murmur heard. Abdominal:     General: Bowel sounds are normal.     Palpations: Abdomen is soft.  Musculoskeletal:     Right lower leg: No edema.     Left lower leg: No edema.  Lymphadenopathy:     Cervical: No cervical adenopathy.  Skin:    General: Skin is warm and dry.  Neurological:     General: No focal deficit present.     Mental Status: He is alert.  Psychiatric:        Mood and Affect: Mood normal.        Behavior: Behavior normal.        Thought Content: Thought content normal.        Judgment: Judgment normal.    CBC    Component Value Date/Time   WBC 4.9 12/18/2021 1106   RBC 5.18 12/18/2021 1106   HGB 15.0 12/18/2021 1106   HCT 44.0 12/18/2021 1106   PLT 268.0 12/18/2021 1106   MCV 85.1 12/18/2021 1106   MCH 28.8 10/02/2019 1549   MCHC 34.0 12/18/2021 1106   RDW 14.1 12/18/2021 1106   LYMPHSABS 1.4 12/18/2021 1106   MONOABS 0.4 12/18/2021 1106   EOSABS 0.1 12/18/2021 1106   BASOSABS 0.1 12/18/2021 1106      Latest Ref Rng & Units 12/18/2021   11:06 AM 10/02/2019    3:49 PM 09/23/2019   10:27 PM  BMP  Glucose 70 - 99 mg/dL 86  86  124   BUN 6 - 23 mg/dL 15  11  8    Creatinine 0.40 - 1.50 mg/dL 1.00  0.83  0.90   Sodium 135 - 145 mEq/L 139  139  139   Potassium 3.5 - 5.1 mEq/L 3.9  3.9  3.5   Chloride 96 - 112 mEq/L 105  101  103   CO2 19 - 32 mEq/L 22  27  27    Calcium 8.4 - 10.5 mg/dL  10.4  9.3  9.2      Chest imaging: CT Chest 02/04/22 Cardiovascular: There is no cardiomegaly or pericardial effusion.  The thoracic aorta and the central pulmonary arteries are grossly unremarkable.   Mediastinum/Nodes: No hilar or mediastinal adenopathy. The esophagus is grossly unremarkable. No mediastinal fluid collection.   Lungs/Pleura: The lungs are clear. There is no pleural effusion or pneumothorax. The central airways are patent.  PFT:     No data to display          Labs:  Path:  Echo 11/18/19: LV EF 60-65%. RV size and function are normal. LA mildly dilated.   Cardiopulmonary Exercise Test 01/07/22 Conclusion: Exercise testing with gas exchange demonstrates normal functional capacity when compared to matched sedentary norms. There is no indication for cardiopulmonary abnormality. Patient presents effectively trained with anaerobic threshold of 79% predicted value.   Heart Catheterization:  Assessment & Plan:   Shortness of breath - Plan: fluticasone-salmeterol (ADVAIR HFA) 115-21 MCG/ACT inhaler, Pulmonary Function Test  Post-nasal drip  Discussion: Lucas Austin is a 32 year old [place], never smoker who is referred to pulmonary clinic for shortness of breath.   His shortness of breath is likely due to anxiety vs reactive airways disease.   He is to try advair HFA 115-21mcg 2 puffs twice daily. He is to use flonase nasal spray, 1 spray per nostril daily. He is to start allegra daily for allergies and rhinitis.   Follow up in 2 months for PFTs.   Freda Jackson, MD [facility] Office: [phone]   Current Outpatient Medications:    ALPRAZolam (XANAX) 0.25 MG tablet, Take 0.25 mg by mouth 2 (two) times daily as needed for anxiety., Disp: , Rfl:    BIOTIN PO, Take 1 tablet by mouth 2 (two) times a week., Disp: , Rfl:    busPIRone (BUSPAR) 5 MG tablet, Take 5 mg by mouth 2 (two) times daily., Disp: , Rfl:     fluticasone-salmeterol (ADVAIR HFA) 115-21 MCG/ACT inhaler, Inhale 2 puffs into the lungs 2 (two) times daily., Disp: 1 each, Rfl: 12   ibuprofen (ADVIL) 200 MG tablet, Take 200 mg by mouth every 8 (eight) hours as needed (pain.)., Disp: , Rfl:

## 2022-07-11 ENCOUNTER — Encounter: Payer: Self-pay | Admitting: Pulmonary Disease

## 2022-07-12 ENCOUNTER — Telehealth: Payer: Self-pay

## 2022-07-12 ENCOUNTER — Other Ambulatory Visit (HOSPITAL_COMMUNITY): Payer: Self-pay

## 2022-07-12 NOTE — Telephone Encounter (Signed)
PA request received via CMM for Advair HFA 115-21MCG/ACT aerosol  PA has been submitted to [employer] and has been APPROVED from 07/12/2022-07/12/2023  Key: [id number]

## 2022-07-15 ENCOUNTER — Ambulatory Visit (INDEPENDENT_AMBULATORY_CARE_PROVIDER_SITE_OTHER): Payer: 59

## 2022-07-15 DIAGNOSIS — R55 Syncope and collapse: Secondary | ICD-10-CM

## 2022-07-15 LAB — CUP PACEART REMOTE DEVICE CHECK
Date Time Interrogation Session: 20240324230555
Implantable Pulse Generator Implant Date: 20210810

## 2022-07-22 NOTE — Progress Notes (Signed)
Carelink Summary Report / Loop Recorder 

## 2022-08-02 ENCOUNTER — Ambulatory Visit
Admission: EM | Admit: 2022-08-02 | Discharge: 2022-08-02 | Disposition: A | Discharge status: Home / Self Care | Payer: 59

## 2022-08-02 ENCOUNTER — Ambulatory Visit (INDEPENDENT_AMBULATORY_CARE_PROVIDER_SITE_OTHER): Payer: 59

## 2022-08-02 DIAGNOSIS — R0781 Pleurodynia: Secondary | ICD-10-CM

## 2022-08-02 DIAGNOSIS — S20211A Contusion of right front wall of thorax, initial encounter: Secondary | ICD-10-CM | POA: Diagnosis not present

## 2022-08-02 DIAGNOSIS — W19XXXA Unspecified fall, initial encounter: Secondary | ICD-10-CM

## 2022-08-02 DIAGNOSIS — R079 Chest pain, unspecified: Secondary | ICD-10-CM | POA: Diagnosis not present

## 2022-08-02 MED ORDER — NAPROXEN 375 MG PO TABS: 375.0000 mg | ORAL_TABLET | Freq: Two times a day (BID) | ORAL | 0 refills | Status: AC

## 2022-08-02 MED ORDER — CYCLOBENZAPRINE HCL 5 MG PO TABS: 5.0000 mg | ORAL_TABLET | Freq: Three times a day (TID) | ORAL | 0 refills | Status: AC | PRN

## 2022-08-02 NOTE — ED Provider Notes (Signed)
[facility]  Note:  This document was prepared using Conservation officer, historic buildings and may include unintentional dictation errors.  MRN: [id number] DOB: [birth year]  Subjective:   Lucas Austin is a 32 y.o. male presenting for suffering a right wrist injury today.  Patient accidentally fell going down the stairs and landed against the steps while he was outstretched.  Has mostly pain along the lateral upper right chest wall.  No bruising, swelling, bony deformity, wounds.  No head injury.  No loss of consciousness.  No current facility-administered medications for this encounter.  Current Outpatient Medications:    fexofenadine (ALLEGRA) 180 MG tablet, Take 180 mg by mouth daily., Disp: , Rfl:    ALPRAZolam (XANAX) 0.25 MG tablet, Take 0.25 mg by mouth 2 (two) times daily as needed for anxiety., Disp: , Rfl:    BIOTIN PO, Take 1 tablet by mouth 2 (two) times a week., Disp: , Rfl:    busPIRone (BUSPAR) 5 MG tablet, Take 5 mg by mouth 2 (two) times daily., Disp: , Rfl:    fluticasone-salmeterol (ADVAIR HFA) 115-21 MCG/ACT inhaler, Inhale 2 puffs into the lungs 2 (two) times daily., Disp: 1 each, Rfl: 12   ibuprofen (ADVIL) 200 MG tablet, Take 200 mg by mouth every 8 (eight) hours as needed (pain.)., Disp: , Rfl:    No Known Allergies  Past Medical History:  Diagnosis Date   Palpitation    PVC's (premature ventricular contractions)      Past Surgical History:  Procedure Laterality Date   implantable loop recorder placement  11/30/2019   Medtronic Reveal Linq model [id number] implantable loop recorder (SN [id number]) by Dr Johney Frame for syncope evaluation    Family History  Problem Relation Age of Onset   Pulmonary fibrosis Mother    Diabetes Maternal Grandmother    Aneurysm Maternal Grandfather    Aneurysm Paternal Grandfather     Social History   Tobacco Use   Smoking status: Never   Smokeless tobacco: Never  Vaping Use   Vaping Use: Never  used  Substance Use Topics   Alcohol use: Never   Drug use: Never    ROS   Objective:   Vitals: BP 125/76 (BP Location: Left Arm)   Pulse 77   Temp 98.4 F (36.9 C) (Oral)   Resp 16   SpO2 98%   Physical Exam Constitutional:      General: He is not in acute distress.    Appearance: Normal appearance. He is well-developed and normal weight. He is not ill-appearing, toxic-appearing or diaphoretic.  HENT:     Head: Normocephalic and atraumatic.     Right Ear: External ear normal.     Left Ear: External ear normal.     Nose: Nose normal.     Mouth/Throat:     Mouth: Mucous membranes are moist.  Eyes:     General: No scleral icterus.       Right eye: No discharge.        Left eye: No discharge.     Extraocular Movements: Extraocular movements intact.  Cardiovascular:     Rate and Rhythm: Normal rate and regular rhythm.     Heart sounds: Normal heart sounds. No murmur heard.    No friction rub. No gallop.  Pulmonary:     Effort: Pulmonary effort is normal. No respiratory distress.     Breath sounds: Normal breath sounds. No stridor. No wheezing, rhonchi or rales.  Chest:  Chest wall: Tenderness present. No mass, lacerations, deformity, swelling, crepitus or edema.       Comments: No ecchymosis, bony deformity. Musculoskeletal:     Cervical back: Normal range of motion.  Neurological:     Mental Status: He is alert and oriented to person, place, and time.  Psychiatric:        Mood and Affect: Mood normal.        Behavior: Behavior normal.        Thought Content: Thought content normal.        Judgment: Judgment normal.    DG Ribs Unilateral W/Chest Right  Result Date: 08/02/2022 CLINICAL DATA:  Trauma, fall, right chest pain EXAM: RIGHT RIBS AND CHEST - 3+ VIEW COMPARISON:  Chest radiographs done on 12/18/2021 FINDINGS: Cardiac size is within normal limits. Lung fields are clear of any infiltrates or pulmonary edema. There is no pleural effusion or  pneumothorax. There is implanted cardiac monitoring device in left chest wall. No displaced fractures are seen in right ribs. IMPRESSION: No fracture is seen in right ribs. No active cardiopulmonary disease. Electronically Signed   By: Ernie Avena M.D.   On: 08/02/2022 14:27    Assessment and Plan :   PDMP not reviewed this encounter.  1. Chest wall contusion, right, initial encounter   2. Rib pain on right side   3. Accidental fall, initial encounter     Recommended conservative management for chest wall contusion.  Rest, modify lifting, naproxen for pain and inflammation. Flexeril for muscle relaxant properties. Counseled patient on potential for adverse effects with medications prescribed/recommended today, ER and return-to-clinic precautions discussed, patient verbalized understanding.    Wallis Bamberg, PA-C 08/02/22 1435

## 2022-08-02 NOTE — ED Triage Notes (Signed)
Pt reports right sided of chest after he fell going downstairs around 1300 today. Pain is worse when moving right arm and deep breath

## 2022-08-08 ENCOUNTER — Ambulatory Visit: Payer: 59 | Admitting: Nurse Practitioner

## 2022-08-19 ENCOUNTER — Ambulatory Visit (INDEPENDENT_AMBULATORY_CARE_PROVIDER_SITE_OTHER): Payer: 59

## 2022-08-19 DIAGNOSIS — R55 Syncope and collapse: Secondary | ICD-10-CM | POA: Diagnosis not present

## 2022-08-19 LAB — CUP PACEART REMOTE DEVICE CHECK
Date Time Interrogation Session: 20240426230022
Implantable Pulse Generator Implant Date: 20210810

## 2022-08-22 NOTE — Progress Notes (Signed)
Carelink Summary Report / Loop Recorder 

## 2022-09-18 ENCOUNTER — Ambulatory Visit (INDEPENDENT_AMBULATORY_CARE_PROVIDER_SITE_OTHER): Payer: 59

## 2022-09-18 DIAGNOSIS — R55 Syncope and collapse: Secondary | ICD-10-CM | POA: Diagnosis not present

## 2022-09-19 ENCOUNTER — Ambulatory Visit (INDEPENDENT_AMBULATORY_CARE_PROVIDER_SITE_OTHER): Payer: 59 | Admitting: Pulmonary Disease

## 2022-09-19 ENCOUNTER — Encounter: Payer: Self-pay | Admitting: Pulmonary Disease

## 2022-09-19 VITALS — BP 120/68 | HR 68

## 2022-09-19 DIAGNOSIS — R0602 Shortness of breath: Secondary | ICD-10-CM

## 2022-09-19 LAB — PULMONARY FUNCTION TEST
DL/VA % pred: 116 %
DL/VA: 5.68 ml/min/mmHg/L
DLCO cor % pred: 112 %
DLCO cor: 34.2 ml/min/mmHg
DLCO unc % pred: 112 %
DLCO unc: 34.2 ml/min/mmHg
FEF 25-75 Post: 2.28 L/sec
FEF 25-75 Pre: 3.11 L/sec
FEF2575-%Change-Post: -26 %
FEF2575-%Pred-Post: 54 %
FEF2575-%Pred-Pre: 75 %
FEV1-%Change-Post: -5 %
FEV1-%Pred-Post: 85 %
FEV1-%Pred-Pre: 91 %
FEV1-Post: 3.55 L
FEV1-Pre: 3.78 L
FEV1FVC-%Change-Post: -3 %
FEV1FVC-%Pred-Pre: 91 %
FEV6-%Change-Post: -4 %
FEV6-%Pred-Post: 96 %
FEV6-%Pred-Pre: 100 %
FEV6-Post: 4.85 L
FEV6-Pre: 5.05 L
FEV6FVC-%Change-Post: 0 %
FEV6FVC-%Pred-Post: 100 %
FEV6FVC-%Pred-Pre: 101 %
FVC-%Change-Post: -2 %
FVC-%Pred-Post: 96 %
FVC-%Pred-Pre: 99 %
FVC-Post: 4.92 L
FVC-Pre: 5.06 L
Post FEV1/FVC ratio: 72 %
Post FEV6/FVC ratio: 99 %
Pre FEV1/FVC ratio: 75 %
Pre FEV6/FVC Ratio: 100 %
RV % pred: 67 %
RV: 1.06 L
TLC % pred: 83 %
TLC: 5.44 L

## 2022-09-19 LAB — CUP PACEART REMOTE DEVICE CHECK
Date Time Interrogation Session: 20240529230121
Implantable Pulse Generator Implant Date: 20210810

## 2022-09-19 NOTE — Patient Instructions (Signed)
Full PFT performed today. °

## 2022-09-19 NOTE — Progress Notes (Signed)
Carelink Summary Report / Loop Recorder 

## 2022-09-19 NOTE — Patient Instructions (Signed)
Your breathing tests are within normal limits  Your CT Chest scan from last fall does not show any lung tissue or airway disease  Follow up as needed

## 2022-09-19 NOTE — Progress Notes (Signed)
Synopsis: Referred in March 2024 for shortness of breath by Rodman Pickle, NP  Subjective:   PATIENT ID: Clearance Lucas Austin GENDER: male DOB: 1991/03/10, MRN: [id number]  HPI  Chief Complaint  Patient presents with   Follow-up    F/U after PFT    Si Lucas Austin is a 32 year old male, never smoker who is referred to pulmonary clinic for shortness of breath.   Initial OV 07/10/22 He reports having shortness of breath for 6 months. He has tried albuterol without much relief. Possibly had viral infection that preceded it. No wheezing. He coughs a lot at night that will wake him up from sleep, 2-3 nights per week. No issues with heartburn or reflux.   He is seeing a psychiatrist for anxiety. He is on buspar and xanax as needed.  He has seasonal allergies that affect his sinuses and lead to post nasal drainage.   Seen by EP 06/26/22, has implanted loop recorder. No arrhythmias noted.   Has normal cardiopulmonary exercise test and CT chest last year.   He was exposed to second hand smoke in childhood. No issues with his breathing in childhood. 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 No harmful dust or chemical exposures. No hobbies with dust exposures. His mom had pulmonary fibrosis from an infection.  OV - 09/19/22 Patient's PFT is within normal limits  He feels his dyspnea has improved with working on  treatment for his anxiety. He did not use the advair inhaler.     Past Medical History:  Diagnosis Date   Palpitation    PVC's (premature ventricular contractions)      Family History  Problem Relation Age of Onset   Pulmonary fibrosis Mother    Diabetes Maternal Grandmother    Aneurysm Maternal Grandfather    Aneurysm Paternal Grandfather      Social History   Socioeconomic History   Marital status: Married    Spouse name: Not on file   Number of children: Not on file   Years of education: Not on file   Highest education level: Not on file  Occupational History    Not on file  Tobacco Use   Smoking status: Never   Smokeless tobacco: Never  Vaping Use   Vaping Use: Never used  Substance and Sexual Activity   Alcohol use: Never   Drug use: Never   Sexual activity: Yes    Comment: wife has nexplanon  Other Topics Concern   Not on file  Social History Narrative   Lives with wife in [place]   Works in Systems developer   Social Determinants of Health   Financial Resource Strain: Not on file  Food Insecurity: Not on file  Transportation Needs: Not on file  Physical Activity: Not on file  Stress: Not on file  Social Connections: Not on file  Intimate Partner Violence: Not on file     No Known Allergies   Outpatient Medications Prior to Visit  Medication Sig Dispense Refill   ALPRAZolam (XANAX) 0.25 MG tablet Take 0.25 mg by mouth 2 (two) times daily as needed for anxiety.     BIOTIN PO Take 1 tablet by mouth 2 (two) times a week.     busPIRone (BUSPAR) 5 MG tablet Take 5 mg by mouth 2 (two) times daily.     cyclobenzaprine (FLEXERIL) 5 MG tablet Take 1 tablet (5 mg total) by mouth 3 (three) times daily as needed for muscle spasms. 30 tablet 0   fexofenadine (ALLEGRA)  180 MG tablet Take 180 mg by mouth daily.     fluticasone-salmeterol (ADVAIR HFA) 115-21 MCG/ACT inhaler Inhale 2 puffs into the lungs 2 (two) times daily. 1 each 12   ibuprofen (ADVIL) 200 MG tablet Take 200 mg by mouth every 8 (eight) hours as needed (pain.).     naproxen (NAPROSYN) 375 MG tablet Take 1 tablet (375 mg total) by mouth 2 (two) times daily with a meal. 30 tablet 0   No facility-administered medications prior to visit.   Review of Systems  Constitutional:  Negative for chills, fever, malaise/fatigue and weight loss.  HENT:  Positive for congestion. Negative for sinus pain and sore throat.   Eyes: Negative.   Respiratory:  Positive for shortness of breath. Negative for cough, hemoptysis, sputum production and wheezing.   Cardiovascular:  Negative for chest  pain, palpitations, orthopnea, claudication and leg swelling.  Gastrointestinal:  Negative for abdominal pain, heartburn, nausea and vomiting.  Genitourinary: Negative.   Musculoskeletal:  Negative for joint pain and myalgias.  Skin:  Negative for rash.  Neurological:  Negative for weakness.  Endo/Heme/Allergies: Negative.   Psychiatric/Behavioral: Negative.     Objective:   Vitals:   09/19/22 1515  BP: 120/68  Pulse: 68  SpO2: 98%     Physical Exam Constitutional:      General: He is not in acute distress. HENT:     Head: Normocephalic and atraumatic.  Eyes:     Conjunctiva/sclera: Conjunctivae normal.  Cardiovascular:     Rate and Rhythm: Normal rate and regular rhythm.     Pulses: Normal pulses.     Heart sounds: Normal heart sounds. No murmur heard. Musculoskeletal:     Right lower leg: No edema.     Left lower leg: No edema.  Skin:    General: Skin is warm and dry.  Neurological:     General: No focal deficit present.     Mental Status: He is alert.    CBC    Component Value Date/Time   WBC 4.9 12/18/2021 1106   RBC 5.18 12/18/2021 1106   HGB 15.0 12/18/2021 1106   HCT 44.0 12/18/2021 1106   PLT 268.0 12/18/2021 1106   MCV 85.1 12/18/2021 1106   MCH 28.8 10/02/2019 1549   MCHC 34.0 12/18/2021 1106   RDW 14.1 12/18/2021 1106   LYMPHSABS 1.4 12/18/2021 1106   MONOABS 0.4 12/18/2021 1106   EOSABS 0.1 12/18/2021 1106   BASOSABS 0.1 12/18/2021 1106      Latest Ref Rng & Units 12/18/2021   11:06 AM 10/02/2019    3:49 PM 09/23/2019   10:27 PM  BMP  Glucose 70 - 99 mg/dL 86  86  161   BUN 6 - 23 mg/dL 15  11  8    Creatinine 0.40 - 1.50 mg/dL 0.96  0.45  4.09   Sodium 135 - 145 mEq/L 139  139  139   Potassium 3.5 - 5.1 mEq/L 3.9  3.9  3.5   Chloride 96 - 112 mEq/L 105  101  103   CO2 19 - 32 mEq/L 22  27  27    Calcium 8.4 - 10.5 mg/dL 81.1  9.3  9.2      Chest imaging: CT Chest 02/04/22 Cardiovascular: There is no cardiomegaly or pericardial  effusion. The thoracic aorta and the central pulmonary arteries are grossly unremarkable.   Mediastinum/Nodes: No hilar or mediastinal adenopathy. The esophagus is grossly unremarkable. No mediastinal fluid collection.   Lungs/Pleura: The lungs are clear.  There is no pleural effusion or pneumothorax. The central airways are patent.  PFT:    Latest Ref Rng & Units 09/19/2022    1:47 PM  PFT Results  FVC-Pre L 5.06  P  FVC-Predicted Pre % 99  P  FVC-Post L 4.92  P  FVC-Predicted Post % 96  P  Pre FEV1/FVC % % 75  P  Post FEV1/FCV % % 72  P  FEV1-Pre L 3.78  P  FEV1-Predicted Pre % 91  P  FEV1-Post L 3.55  P  DLCO uncorrected ml/min/mmHg 34.20  P  DLCO UNC% % 112  P  DLCO corrected ml/min/mmHg 34.20  P  DLCO COR %Predicted % 112  P  DLVA Predicted % 116  P  TLC L 5.44  P  TLC % Predicted % 83  P  RV % Predicted % 67  P    P Preliminary result     Labs:  Path:  Echo 11/18/19: LV EF 60-65%. RV size and function are normal. LA mildly dilated.   Cardiopulmonary Exercise Test 01/07/22 Conclusion: Exercise testing with gas exchange demonstrates normal functional capacity when compared to matched sedentary norms. There is no indication for cardiopulmonary abnormality. Patient presents effectively trained with anaerobic threshold of 79% predicted value.   Heart Catheterization:  Assessment & Plan:   No diagnosis found.  Discussion: Lucas Austin is a 32 year old male, never smoker who returns to pulmonary clinic for shortness of breath.   His shortness of breath is likely due to anxiety. PFTs are within normal limits.   Follow up as needed.   Melody Comas, MD [facility] Office: [phone]   Current Outpatient Medications:    ALPRAZolam (XANAX) 0.25 MG tablet, Take 0.25 mg by mouth 2 (two) times daily as needed for anxiety., Disp: , Rfl:    BIOTIN PO, Take 1 tablet by mouth 2 (two) times a week., Disp: , Rfl:    busPIRone (BUSPAR) 5  MG tablet, Take 5 mg by mouth 2 (two) times daily., Disp: , Rfl:    cyclobenzaprine (FLEXERIL) 5 MG tablet, Take 1 tablet (5 mg total) by mouth 3 (three) times daily as needed for muscle spasms., Disp: 30 tablet, Rfl: 0   fexofenadine (ALLEGRA) 180 MG tablet, Take 180 mg by mouth daily., Disp: , Rfl:    fluticasone-salmeterol (ADVAIR HFA) 115-21 MCG/ACT inhaler, Inhale 2 puffs into the lungs 2 (two) times daily., Disp: 1 each, Rfl: 12   ibuprofen (ADVIL) 200 MG tablet, Take 200 mg by mouth every 8 (eight) hours as needed (pain.)., Disp: , Rfl:    naproxen (NAPROSYN) 375 MG tablet, Take 1 tablet (375 mg total) by mouth 2 (two) times daily with a meal., Disp: 30 tablet, Rfl: 0

## 2022-09-19 NOTE — Progress Notes (Signed)
Full PFT performed today. °

## 2022-09-19 NOTE — Progress Notes (Signed)
Synopsis: Referred in March 2024 for shortness of breath by Rodman Pickle, NP  Subjective:   PATIENT ID: Lucas Austin GENDER: male DOB: 30-Jan-1991, MRN: [id number]  HPI  Chief Complaint  Patient presents with   Follow-up    F/U after PFT    Lucas Austin is a 32 year old male, never smoker who is referred to pulmonary clinic for shortness of breath.   He reports having shortness of breath for 6 months. He has tried albuterol without much relief. Possibly had viral infection that preceded it. No wheezing. He coughs a lot at night that will wake him up from sleep, 2-3 nights per week. No issues with heartburn or reflux.   He is seeing a psychiatrist for anxiety. He is on buspar and xanax as needed.  He has seasonal allergies that affect his sinuses and lead to post nasal drainage.   Seen by EP 06/26/22, has implanted loop recorder. No arrhythmias noted.   Has normal cardiopulmonary exercise test and CT chest last year.   He was exposed to second hand smoke in childhood. No issues with his breathing in childhood. 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 No harmful dust or chemical exposures. No hobbies with dust exposures. His mom had pulmonary fibrosis from an infection.    Past Medical History:  Diagnosis Date   Palpitation    PVC's (premature ventricular contractions)      Family History  Problem Relation Age of Onset   Pulmonary fibrosis Mother    Diabetes Maternal Grandmother    Aneurysm Maternal Grandfather    Aneurysm Paternal Grandfather      Social History   Socioeconomic History   Marital status: Married    Spouse name: Not on file   Number of children: Not on file   Years of education: Not on file   Highest education level: Not on file  Occupational History   Not on file  Tobacco Use   Smoking status: Never   Smokeless tobacco: Never  Vaping Use   Vaping Use: Never used  Substance and Sexual Activity   Alcohol use: Never   Drug use: Never    Sexual activity: Yes    Comment: wife has nexplanon  Other Topics Concern   Not on file  Social History Narrative   Lives with wife in [place]   Works in Systems developer   Social Determinants of Health   Financial Resource Strain: Not on file  Food Insecurity: Not on file  Transportation Needs: Not on file  Physical Activity: Not on file  Stress: Not on file  Social Connections: Not on file  Intimate Partner Violence: Not on file     No Known Allergies   Outpatient Medications Prior to Visit  Medication Sig Dispense Refill   ALPRAZolam (XANAX) 0.25 MG tablet Take 0.25 mg by mouth 2 (two) times daily as needed for anxiety.     BIOTIN PO Take 1 tablet by mouth 2 (two) times a week.     busPIRone (BUSPAR) 5 MG tablet Take 5 mg by mouth 2 (two) times daily.     cyclobenzaprine (FLEXERIL) 5 MG tablet Take 1 tablet (5 mg total) by mouth 3 (three) times daily as needed for muscle spasms. 30 tablet 0   fexofenadine (ALLEGRA) 180 MG tablet Take 180 mg by mouth daily.     ibuprofen (ADVIL) 200 MG tablet Take 200 mg by mouth every 8 (eight) hours as needed (pain.).     naproxen (NAPROSYN)  375 MG tablet Take 1 tablet (375 mg total) by mouth 2 (two) times daily with a meal. 30 tablet 0   fluticasone-salmeterol (ADVAIR HFA) 115-21 MCG/ACT inhaler Inhale 2 puffs into the lungs 2 (two) times daily. 1 each 12   No facility-administered medications prior to visit.   Review of Systems  Constitutional:  Negative for chills, fever, malaise/fatigue and weight loss.  HENT:  Positive for congestion. Negative for sinus pain and sore throat.   Eyes: Negative.   Respiratory:  Positive for shortness of breath. Negative for cough, hemoptysis, sputum production and wheezing.   Cardiovascular:  Negative for chest pain, palpitations, orthopnea, claudication and leg swelling.  Gastrointestinal:  Negative for abdominal pain, heartburn, nausea and vomiting.  Genitourinary: Negative.   Musculoskeletal:   Negative for joint pain and myalgias.  Skin:  Negative for rash.  Neurological:  Negative for weakness.  Endo/Heme/Allergies: Negative.   Psychiatric/Behavioral: Negative.     Objective:   Vitals:   09/19/22 1515  BP: 120/68  Pulse: 68  SpO2: 98%     Physical Exam Constitutional:      General: He is not in acute distress. HENT:     Head: Normocephalic and atraumatic.     Nose: Rhinorrhea present.  Eyes:     Extraocular Movements: Extraocular movements intact.     Conjunctiva/sclera: Conjunctivae normal.     Pupils: Pupils are equal, round, and reactive to light.  Cardiovascular:     Rate and Rhythm: Normal rate and regular rhythm.     Pulses: Normal pulses.     Heart sounds: Normal heart sounds. No murmur heard. Abdominal:     General: Bowel sounds are normal.     Palpations: Abdomen is soft.  Musculoskeletal:     Right lower leg: No edema.     Left lower leg: No edema.  Lymphadenopathy:     Cervical: No cervical adenopathy.  Skin:    General: Skin is warm and dry.  Neurological:     General: No focal deficit present.     Mental Status: He is alert.  Psychiatric:        Mood and Affect: Mood normal.        Behavior: Behavior normal.        Thought Content: Thought content normal.        Judgment: Judgment normal.    CBC    Component Value Date/Time   WBC 4.9 12/18/2021 1106   RBC 5.18 12/18/2021 1106   HGB 15.0 12/18/2021 1106   HCT 44.0 12/18/2021 1106   PLT 268.0 12/18/2021 1106   MCV 85.1 12/18/2021 1106   MCH 28.8 10/02/2019 1549   MCHC 34.0 12/18/2021 1106   RDW 14.1 12/18/2021 1106   LYMPHSABS 1.4 12/18/2021 1106   MONOABS 0.4 12/18/2021 1106   EOSABS 0.1 12/18/2021 1106   BASOSABS 0.1 12/18/2021 1106      Latest Ref Rng & Units 12/18/2021   11:06 AM 10/02/2019    3:49 PM 09/23/2019   10:27 PM  BMP  Glucose 70 - 99 mg/dL 86  86  161   BUN 6 - 23 mg/dL 15  11  8    Creatinine 0.40 - 1.50 mg/dL 0.96  0.45  4.09   Sodium 135 - 145 mEq/L 139   139  139   Potassium 3.5 - 5.1 mEq/L 3.9  3.9  3.5   Chloride 96 - 112 mEq/L 105  101  103   CO2 19 - 32 mEq/L 22  27  27   Calcium 8.4 - 10.5 mg/dL 16.1  9.3  9.2      Chest imaging: CT Chest 02/04/22 Cardiovascular: There is no cardiomegaly or pericardial effusion. The thoracic aorta and the central pulmonary arteries are grossly unremarkable.   Mediastinum/Nodes: No hilar or mediastinal adenopathy. The esophagus is grossly unremarkable. No mediastinal fluid collection.   Lungs/Pleura: The lungs are clear. There is no pleural effusion or pneumothorax. The central airways are patent.  PFT:    Latest Ref Rng & Units 09/19/2022    1:47 PM  PFT Results  FVC-Pre L 5.06  P  FVC-Predicted Pre % 99  P  FVC-Post L 4.92  P  FVC-Predicted Post % 96  P  Pre FEV1/FVC % % 75  P  Post FEV1/FCV % % 72  P  FEV1-Pre L 3.78  P  FEV1-Predicted Pre % 91  P  FEV1-Post L 3.55  P  DLCO uncorrected ml/min/mmHg 34.20  P  DLCO UNC% % 112  P  DLCO corrected ml/min/mmHg 34.20  P  DLCO COR %Predicted % 112  P  DLVA Predicted % 116  P  TLC L 5.44  P  TLC % Predicted % 83  P  RV % Predicted % 67  P    P Preliminary result    Labs:  Path:  Echo 11/18/19: LV EF 60-65%. RV size and function are normal. LA mildly dilated.   Cardiopulmonary Exercise Test 01/07/22 Conclusion: Exercise testing with gas exchange demonstrates normal functional capacity when compared to matched sedentary norms. There is no indication for cardiopulmonary abnormality. Patient presents effectively trained with anaerobic threshold of 79% predicted value.   Heart Catheterization:  Assessment & Plan:   Shortness of breath  Discussion: Lucas Austin is a 32 year old male, never smoker who is referred to pulmonary clinic for shortness of breath.   His shortness of breath is likely due to anxiety vs reactive airways disease.   He is to try advair HFA 115-40mcg 2 puffs twice daily. He is to use flonase nasal spray,  1 spray per nostril daily. He is to start allegra daily for allergies and rhinitis.   Follow up in 2 months for PFTs.   Melody Comas, MD [facility] Office: [phone]   Current Outpatient Medications:    ALPRAZolam (XANAX) 0.25 MG tablet, Take 0.25 mg by mouth 2 (two) times daily as needed for anxiety., Disp: , Rfl:    BIOTIN PO, Take 1 tablet by mouth 2 (two) times a week., Disp: , Rfl:    busPIRone (BUSPAR) 5 MG tablet, Take 5 mg by mouth 2 (two) times daily., Disp: , Rfl:    cyclobenzaprine (FLEXERIL) 5 MG tablet, Take 1 tablet (5 mg total) by mouth 3 (three) times daily as needed for muscle spasms., Disp: 30 tablet, Rfl: 0   fexofenadine (ALLEGRA) 180 MG tablet, Take 180 mg by mouth daily., Disp: , Rfl:    ibuprofen (ADVIL) 200 MG tablet, Take 200 mg by mouth every 8 (eight) hours as needed (pain.)., Disp: , Rfl:    naproxen (NAPROSYN) 375 MG tablet, Take 1 tablet (375 mg total) by mouth 2 (two) times daily with a meal., Disp: 30 tablet, Rfl: 0

## 2022-10-04 ENCOUNTER — Encounter: Payer: Self-pay | Admitting: Pulmonary Disease

## 2022-10-10 NOTE — Progress Notes (Signed)
Carelink Summary Report / Loop Recorder 

## 2022-10-21 ENCOUNTER — Ambulatory Visit (INDEPENDENT_AMBULATORY_CARE_PROVIDER_SITE_OTHER): Payer: 59

## 2022-10-21 DIAGNOSIS — R55 Syncope and collapse: Secondary | ICD-10-CM | POA: Diagnosis not present

## 2022-10-22 LAB — CUP PACEART REMOTE DEVICE CHECK
Date Time Interrogation Session: 20240701230123
Implantable Pulse Generator Implant Date: 20210810

## 2022-11-06 NOTE — Progress Notes (Signed)
 [facility] Summary Report / Loop Recorder 

## 2022-11-24 LAB — CUP PACEART REMOTE DEVICE CHECK
Date Time Interrogation Session: 20240803230045
Implantable Pulse Generator Implant Date: 20210810

## 2022-11-25 ENCOUNTER — Ambulatory Visit (INDEPENDENT_AMBULATORY_CARE_PROVIDER_SITE_OTHER): Payer: 59

## 2022-11-25 DIAGNOSIS — R55 Syncope and collapse: Secondary | ICD-10-CM

## 2022-12-05 NOTE — Progress Notes (Signed)
Carelink Summary Report / Loop Recorder 

## 2022-12-27 LAB — CUP PACEART REMOTE DEVICE CHECK
Date Time Interrogation Session: 20240905230050
Implantable Pulse Generator Implant Date: 20210810

## 2022-12-30 ENCOUNTER — Ambulatory Visit (INDEPENDENT_AMBULATORY_CARE_PROVIDER_SITE_OTHER): Payer: 59

## 2022-12-30 DIAGNOSIS — R002 Palpitations: Secondary | ICD-10-CM

## 2022-12-30 DIAGNOSIS — R55 Syncope and collapse: Secondary | ICD-10-CM

## 2023-01-09 NOTE — Progress Notes (Signed)
Carelink Summary Report / Loop Recorder 

## 2023-02-03 ENCOUNTER — Ambulatory Visit (INDEPENDENT_AMBULATORY_CARE_PROVIDER_SITE_OTHER): Payer: 59

## 2023-02-03 DIAGNOSIS — R55 Syncope and collapse: Secondary | ICD-10-CM

## 2023-02-03 DIAGNOSIS — R002 Palpitations: Secondary | ICD-10-CM

## 2023-02-03 LAB — CUP PACEART REMOTE DEVICE CHECK
Date Time Interrogation Session: 20241013230523
Implantable Pulse Generator Implant Date: 20210810

## 2023-02-17 NOTE — Progress Notes (Signed)
Carelink Summary Report / Loop Recorder 

## 2023-03-10 ENCOUNTER — Ambulatory Visit (INDEPENDENT_AMBULATORY_CARE_PROVIDER_SITE_OTHER): Payer: 59

## 2023-03-10 DIAGNOSIS — R002 Palpitations: Secondary | ICD-10-CM

## 2023-03-10 DIAGNOSIS — R55 Syncope and collapse: Secondary | ICD-10-CM | POA: Diagnosis not present

## 2023-03-10 LAB — CUP PACEART REMOTE DEVICE CHECK
Date Time Interrogation Session: 20241117230718
Implantable Pulse Generator Implant Date: 20210810

## 2023-04-02 NOTE — Progress Notes (Signed)
Carelink Summary Report / Loop Recorder 

## 2023-04-14 ENCOUNTER — Ambulatory Visit (INDEPENDENT_AMBULATORY_CARE_PROVIDER_SITE_OTHER): Payer: 59

## 2023-04-14 DIAGNOSIS — R55 Syncope and collapse: Secondary | ICD-10-CM

## 2023-04-14 DIAGNOSIS — R002 Palpitations: Secondary | ICD-10-CM

## 2023-04-14 LAB — CUP PACEART REMOTE DEVICE CHECK
Date Time Interrogation Session: 20241222230317
Implantable Pulse Generator Implant Date: 20210810

## 2023-05-19 ENCOUNTER — Ambulatory Visit (INDEPENDENT_AMBULATORY_CARE_PROVIDER_SITE_OTHER): Payer: 59

## 2023-05-19 DIAGNOSIS — R55 Syncope and collapse: Secondary | ICD-10-CM

## 2023-05-19 DIAGNOSIS — R002 Palpitations: Secondary | ICD-10-CM

## 2023-05-19 LAB — CUP PACEART REMOTE DEVICE CHECK
Date Time Interrogation Session: 20250126230144
Implantable Pulse Generator Implant Date: 20210810

## 2023-05-20 NOTE — Progress Notes (Signed)
Carelink Summary Report / Loop Recorder

## 2023-06-23 ENCOUNTER — Ambulatory Visit (INDEPENDENT_AMBULATORY_CARE_PROVIDER_SITE_OTHER): Payer: 59

## 2023-06-23 DIAGNOSIS — R002 Palpitations: Secondary | ICD-10-CM

## 2023-06-23 DIAGNOSIS — R55 Syncope and collapse: Secondary | ICD-10-CM

## 2023-06-23 LAB — CUP PACEART REMOTE DEVICE CHECK
Date Time Interrogation Session: 20250302230203
Implantable Pulse Generator Implant Date: 20210810

## 2023-06-25 NOTE — Progress Notes (Signed)
 [facility] Summary Report / Loop Recorder

## 2023-07-23 NOTE — Progress Notes (Signed)
 [facility] Summary Report / Loop Recorder

## 2023-07-28 ENCOUNTER — Ambulatory Visit (INDEPENDENT_AMBULATORY_CARE_PROVIDER_SITE_OTHER): Payer: 59

## 2023-07-28 DIAGNOSIS — R55 Syncope and collapse: Secondary | ICD-10-CM

## 2023-07-28 DIAGNOSIS — R002 Palpitations: Secondary | ICD-10-CM

## 2023-07-28 LAB — CUP PACEART REMOTE DEVICE CHECK
Date Time Interrogation Session: 20250406230251
Implantable Pulse Generator Implant Date: 20210810

## 2023-09-01 ENCOUNTER — Ambulatory Visit (INDEPENDENT_AMBULATORY_CARE_PROVIDER_SITE_OTHER): Payer: 59

## 2023-09-01 DIAGNOSIS — R002 Palpitations: Secondary | ICD-10-CM

## 2023-09-01 DIAGNOSIS — R55 Syncope and collapse: Secondary | ICD-10-CM

## 2023-09-01 LAB — CUP PACEART REMOTE DEVICE CHECK
Date Time Interrogation Session: 20250511233403
Implantable Pulse Generator Implant Date: 20210810

## 2023-09-08 ENCOUNTER — Ambulatory Visit: Payer: Self-pay | Admitting: Cardiology

## 2023-09-08 NOTE — Progress Notes (Signed)
 [facility] Summary Report / Loop Recorder

## 2023-10-02 ENCOUNTER — Ambulatory Visit (INDEPENDENT_AMBULATORY_CARE_PROVIDER_SITE_OTHER)

## 2023-10-02 DIAGNOSIS — R55 Syncope and collapse: Secondary | ICD-10-CM

## 2023-10-02 LAB — CUP PACEART REMOTE DEVICE CHECK
Date Time Interrogation Session: 20250611230330
Implantable Pulse Generator Implant Date: 20210810

## 2023-10-07 ENCOUNTER — Ambulatory Visit: Payer: Self-pay | Admitting: Cardiology

## 2023-10-15 NOTE — Progress Notes (Signed)
 [facility] Summary Report / Loop Recorder

## 2023-11-03 ENCOUNTER — Ambulatory Visit: Payer: Self-pay | Admitting: Cardiology

## 2023-11-03 ENCOUNTER — Ambulatory Visit

## 2023-11-03 DIAGNOSIS — R55 Syncope and collapse: Secondary | ICD-10-CM

## 2023-11-03 LAB — CUP PACEART REMOTE DEVICE CHECK
Date Time Interrogation Session: 20250712230317
Implantable Pulse Generator Implant Date: 20210810

## 2023-11-25 NOTE — Progress Notes (Signed)
 [facility] Summary Report / Loop Recorder

## 2023-12-04 ENCOUNTER — Encounter

## 2024-01-05 ENCOUNTER — Encounter

## 2024-01-28 NOTE — Progress Notes (Signed)
 Remote Loop Recorder Transmission

## 2024-02-05 ENCOUNTER — Encounter

## 2024-03-07 ENCOUNTER — Ambulatory Visit: Payer: Self-pay

## 2024-03-08 ENCOUNTER — Encounter

## 2024-04-07 ENCOUNTER — Ambulatory Visit: Payer: Self-pay | Attending: Nurse Practitioner

## 2024-04-08 ENCOUNTER — Encounter

## 2024-05-08 ENCOUNTER — Ambulatory Visit: Payer: Self-pay

## 2024-05-10 ENCOUNTER — Encounter

## 2024-06-08 ENCOUNTER — Ambulatory Visit: Payer: Self-pay

## 2024-06-10 ENCOUNTER — Encounter

## 2024-07-09 ENCOUNTER — Ambulatory Visit: Payer: Self-pay

## 2024-07-12 ENCOUNTER — Encounter

## 2024-08-09 ENCOUNTER — Ambulatory Visit: Payer: Self-pay

## 2024-08-12 ENCOUNTER — Encounter
